# Patient Record
Sex: Female | Born: 1975 | Race: White | Hispanic: No | Marital: Married | Smoking: Never smoker
Health system: Southern US, Community
[De-identification: ages and names within clinical notes are randomized; demographics above are authoritative.]

## PROBLEM LIST (undated history)

## (undated) DIAGNOSIS — N189 Chronic kidney disease, unspecified: Secondary | ICD-10-CM

## (undated) DIAGNOSIS — I471 Supraventricular tachycardia, unspecified: Secondary | ICD-10-CM

## (undated) DIAGNOSIS — E785 Hyperlipidemia, unspecified: Secondary | ICD-10-CM

## (undated) DIAGNOSIS — F329 Major depressive disorder, single episode, unspecified: Secondary | ICD-10-CM

## (undated) DIAGNOSIS — I499 Cardiac arrhythmia, unspecified: Secondary | ICD-10-CM

## (undated) DIAGNOSIS — F32A Depression, unspecified: Secondary | ICD-10-CM

## (undated) HISTORY — PX: ESOPHAGOGASTRODUODENOSCOPY: SHX1529

## (undated) HISTORY — DX: Hyperlipidemia, unspecified: E78.5

## (undated) HISTORY — DX: Chronic kidney disease, unspecified: N18.9

## (undated) HISTORY — DX: Supraventricular tachycardia: I47.1

## (undated) HISTORY — DX: Cardiac arrhythmia, unspecified: I49.9

## (undated) HISTORY — DX: Supraventricular tachycardia, unspecified: I47.10

---
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
## 1996-09-01 HISTORY — PX: WISDOM TOOTH EXTRACTION: SHX21

## 1999-12-02 ENCOUNTER — Encounter: Admission: RE | Admit: 1999-12-02 | Discharge: 2000-03-01 | Payer: Self-pay | Admitting: *Deleted

## 2003-10-05 ENCOUNTER — Other Ambulatory Visit: Admission: RE | Admit: 2003-10-05 | Discharge: 2003-10-05 | Payer: Self-pay | Admitting: Family Medicine

## 2005-03-14 ENCOUNTER — Other Ambulatory Visit: Admission: RE | Admit: 2005-03-14 | Discharge: 2005-03-14 | Payer: Self-pay | Admitting: Family Medicine

## 2010-08-07 DIAGNOSIS — F32A Depression, unspecified: Secondary | ICD-10-CM | POA: Insufficient documentation

## 2011-01-09 DIAGNOSIS — Q839 Congenital malformation of breast, unspecified: Secondary | ICD-10-CM | POA: Insufficient documentation

## 2011-09-02 HISTORY — PX: DILATION AND CURETTAGE OF UTERUS: SHX78

## 2018-09-01 HISTORY — PX: BREAST CYST ASPIRATION: SHX578

## 2019-05-04 ENCOUNTER — Encounter (HOSPITAL_COMMUNITY): Payer: Self-pay

## 2019-05-04 ENCOUNTER — Other Ambulatory Visit: Payer: Self-pay

## 2019-05-04 ENCOUNTER — Ambulatory Visit (HOSPITAL_COMMUNITY)
Admission: EM | Admit: 2019-05-04 | Discharge: 2019-05-04 | Disposition: A | Discharge status: Home / Self Care | Payer: 59 | Attending: Family Medicine | Admitting: Family Medicine

## 2019-05-04 DIAGNOSIS — W458XXA Other foreign body or object entering through skin, initial encounter: Secondary | ICD-10-CM | POA: Diagnosis not present

## 2019-05-04 DIAGNOSIS — S61012A Laceration without foreign body of left thumb without damage to nail, initial encounter: Secondary | ICD-10-CM | POA: Diagnosis not present

## 2019-05-04 DIAGNOSIS — Z23 Encounter for immunization: Secondary | ICD-10-CM

## 2019-05-04 HISTORY — DX: Depression, unspecified: F32.A

## 2019-05-04 MED ORDER — TETANUS-DIPHTH-ACELL PERTUSSIS 5-2.5-18.5 LF-MCG/0.5 IM SUSP
INTRAMUSCULAR | Status: AC
  Filled 2019-05-04: qty 0.5

## 2019-05-04 MED ORDER — TETANUS-DIPHTH-ACELL PERTUSSIS 5-2.5-18.5 LF-MCG/0.5 IM SUSP
0.5000 mL | Freq: Once | INTRAMUSCULAR | Status: AC
  Administered 2019-05-04: 0.5 mL via INTRAMUSCULAR

## 2019-05-04 NOTE — ED Triage Notes (Signed)
Patient presents to Urgent Care with complaints of laceration to left thumb since cutting an onion this morning. Patient reports she wrapped her thumb tightly with a bandaid and 5 hours later it is still bleeding significantly. Tetanus likely 43 years old.

## 2019-05-04 NOTE — ED Provider Notes (Signed)
[facility]    CSN: [id number] Arrival date & time: 05/04/19  1444      History   Chief Complaint Chief Complaint  Patient presents with  . Appointment  . Extremity Laceration    HPI Anita Ferguson is a 43 y.o. female.   Anita Ferguson presents with complaints of laceration to tip of left thumb. She was slicing vegetables this morning and accidentally cut her thumb. She is right handed. She rinsed it briefly under water and then applied bandages. Approximately 5 hours later she removed the bandage and had significant bleeding therefore she came to be seen. Some numbness directly to the site but not generally. Unknown last tdap. No further pain. No active bleeding. Without contributing medical history.      ROS per HPI, negative if not otherwise mentioned.      Past Medical History:  Diagnosis Date  . Depression     There are no active problems to display for this patient.   Past Surgical History:  Procedure Laterality Date  . CESAREAN SECTION    . DILATION AND CURETTAGE OF UTERUS  2013  . ESOPHAGOGASTRODUODENOSCOPY    . WISDOM TOOTH EXTRACTION  1998    OB History   No obstetric history on file.      Home Medications    Prior to Admission medications   Medication Sig Start Date End Date Taking? Authorizing Provider  buPROPion (WELLBUTRIN XL) 300 MG 24 hr tablet Take 300 mg by mouth daily.   Yes [provider]  fexofenadine-pseudoephedrine (ALLEGRA-D 24) 180-240 MG 24 hr tablet Take 1 tablet by mouth daily.   Yes [provider]    Family History Family History  Problem Relation Age of Onset  . Cancer Mother   . Cancer Father   . Hypertension Father     Social History Social History   Tobacco Use  . Smoking status: Never Smoker  . Smokeless tobacco: Never Used  Substance Use Topics  . Alcohol use: Not Currently  . Drug use: Never     Allergies   Morphine and related   Review of Systems Review of Systems    Physical Exam Triage Vital Signs ED Triage Vitals  Enc Vitals Group     BP 05/04/19 1512 (!) 133/98     Pulse Rate 05/04/19 1512 79     Resp 05/04/19 1512 15     Temp 05/04/19 1512 98.2 F (36.8 C)     Temp Source 05/04/19 1512 Oral     SpO2 05/04/19 1512 99 %     Weight --      Height --      Head Circumference --      Peak Flow --      Pain Score 05/04/19 1506 0     Pain Loc --      Pain Edu? --      Excl. in GC? --    No data found.  Updated Vital Signs BP (!) 133/98 (BP Location: Right Arm)   Pulse 79   Temp 98.2 F (36.8 C) (Oral)   Resp 15   LMP 04/15/2019 (Exact Date)   SpO2 99%    Physical Exam Constitutional:      General: She is not in acute distress.    Appearance: She is well-developed.  Cardiovascular:     Rate and Rhythm: Normal rate.  Pulmonary:     Effort: Pulmonary effort is normal.  Musculoskeletal:     Left hand: She  exhibits laceration. She exhibits normal range of motion, no tenderness and no bony tenderness.     Comments: Very distal tip of left thumb with triangle shaped laceration which has started to heel, unable to fully lift to determine depth. No pain; no active bleeding; no nail or joint involvement. Full ROM of finger  Skin:    General: Skin is warm and dry.  Neurological:     Mental Status: She is alert and oriented to person, place, and time.      UC Treatments / Results  Labs (all labs ordered are listed, but only abnormal results are displayed) Labs Reviewed - No data to display  EKG   Radiology No results found.  Procedures Laceration Repair  Date/Time: 05/04/2019 5:16 PM Performed by: Zigmund Gottron, NP Authorized by: Vanessa Kick, MD   Consent:    Consent obtained:  Verbal   Consent given by:  Patient   Risks discussed:  Infection, need for additional repair, pain and poor cosmetic result   Alternatives discussed:  No treatment, observation, delayed treatment and referral Anesthesia (see MAR for exact  dosages):    Anesthesia method:  None Laceration details:    Location:  Finger   Finger location:  L thumb   Length (cm):  0.3   Depth (mm):  1 Repair type:    Repair type:  Simple Exploration:    Hemostasis achieved with:  Direct pressure   Wound exploration: wound explored through full range of motion     Wound extent: no nerve damage noted, no tendon damage noted, no underlying fracture noted and no vascular damage noted   Treatment:    Area cleansed with:  Shur-Clens   Amount of cleaning:  Standard Skin repair:    Repair method:  Tissue adhesive Approximation:    Approximation:  Close Post-procedure details:    Dressing:  Bulky dressing   (including critical care time)  Medications Ordered in UC Medications  Tdap (BOOSTRIX) injection 0.5 mL (0.5 mLs Intramuscular Given 05/04/19 1542)  Tdap (BOOSTRIX) 5-2.5-18.5 LF-MCG/0.5 injection (has no administration in time range)    Initial Impression / Assessment and Plan / UC Course  I have reviewed the triage vital signs and the nursing notes.  Pertinent labs & imaging results that were available during my care of the patient were reviewed by me and considered in my medical decision making (see chart for details).     Wound glue applied to reinforce already apparent healing laceration. Bulky dressing applied for general protection. Wound care discussed. Patient verbalized understanding and agreeable to plan.   Final Clinical Impressions(s) / UC Diagnoses   Final diagnoses:  Laceration of left thumb without foreign body without damage to nail, initial encounter     Discharge Instructions     Ice, elevation, ibuprofen as needed for pain control.  Wound glue for wound closure, ideally to remain in place for 5 days or longer. Will peel off. May keep covered to keep it protected from prematurely falling off.  If falls off too early wash daily with soap and water and keep covered to keep clean.  If develop any increased pain,  redness or drainage please return to be seen.    ED Prescriptions    None     Controlled Substance Prescriptions [place] Controlled Substance Registry consulted? Not Applicable   Zigmund Gottron, NP 05/04/19 1718

## 2019-05-04 NOTE — Discharge Instructions (Signed)
Ice, elevation, ibuprofen as needed for pain control.  Wound glue for wound closure, ideally to remain in place for 5 days or longer. Will peel off. May keep covered to keep it protected from prematurely falling off.  If falls off too early wash daily with soap and water and keep covered to keep clean.  If develop any increased pain, redness or drainage please return to be seen.

## 2019-07-07 ENCOUNTER — Other Ambulatory Visit: Payer: Self-pay | Admitting: Obstetrics and Gynecology

## 2019-07-07 DIAGNOSIS — R928 Other abnormal and inconclusive findings on diagnostic imaging of breast: Secondary | ICD-10-CM

## 2019-07-11 ENCOUNTER — Other Ambulatory Visit: Payer: Self-pay | Admitting: Obstetrics and Gynecology

## 2019-07-11 ENCOUNTER — Ambulatory Visit
Admission: RE | Admit: 2019-07-11 | Discharge: 2019-07-11 | Disposition: A | Discharge status: Home / Self Care | Payer: 59 | Source: Ambulatory Visit | Attending: Obstetrics and Gynecology | Admitting: Obstetrics and Gynecology

## 2019-07-11 ENCOUNTER — Other Ambulatory Visit: Payer: Self-pay

## 2019-07-11 DIAGNOSIS — R928 Other abnormal and inconclusive findings on diagnostic imaging of breast: Secondary | ICD-10-CM

## 2019-07-11 DIAGNOSIS — R599 Enlarged lymph nodes, unspecified: Secondary | ICD-10-CM

## 2019-07-11 DIAGNOSIS — N6001 Solitary cyst of right breast: Secondary | ICD-10-CM

## 2019-07-13 ENCOUNTER — Other Ambulatory Visit: Payer: Self-pay | Admitting: Obstetrics and Gynecology

## 2019-07-13 ENCOUNTER — Ambulatory Visit
Admission: RE | Admit: 2019-07-13 | Discharge: 2019-07-13 | Disposition: A | Discharge status: Home / Self Care | Payer: 59 | Source: Ambulatory Visit | Attending: Obstetrics and Gynecology | Admitting: Obstetrics and Gynecology

## 2019-07-13 ENCOUNTER — Other Ambulatory Visit: Payer: Self-pay

## 2019-07-13 DIAGNOSIS — N6001 Solitary cyst of right breast: Secondary | ICD-10-CM

## 2019-07-13 DIAGNOSIS — R599 Enlarged lymph nodes, unspecified: Secondary | ICD-10-CM

## 2019-10-18 ENCOUNTER — Other Ambulatory Visit: Payer: Self-pay

## 2019-10-18 ENCOUNTER — Ambulatory Visit
Admission: RE | Admit: 2019-10-18 | Discharge: 2019-10-18 | Disposition: A | Discharge status: Home / Self Care | Payer: Managed Care, Other (non HMO) | Source: Ambulatory Visit | Attending: Obstetrics and Gynecology | Admitting: Obstetrics and Gynecology

## 2019-10-18 DIAGNOSIS — N6001 Solitary cyst of right breast: Secondary | ICD-10-CM

## 2020-10-18 DIAGNOSIS — Z809 Family history of malignant neoplasm, unspecified: Secondary | ICD-10-CM | POA: Insufficient documentation

## 2020-10-24 ENCOUNTER — Other Ambulatory Visit: Payer: Self-pay | Admitting: Obstetrics and Gynecology

## 2020-10-24 DIAGNOSIS — R928 Other abnormal and inconclusive findings on diagnostic imaging of breast: Secondary | ICD-10-CM

## 2020-11-06 ENCOUNTER — Other Ambulatory Visit: Payer: Self-pay

## 2020-11-06 ENCOUNTER — Ambulatory Visit
Admission: RE | Admit: 2020-11-06 | Discharge: 2020-11-06 | Disposition: A | Discharge status: Home / Self Care | Payer: Managed Care, Other (non HMO) | Source: Ambulatory Visit | Attending: Obstetrics and Gynecology | Admitting: Obstetrics and Gynecology

## 2020-11-06 DIAGNOSIS — R928 Other abnormal and inconclusive findings on diagnostic imaging of breast: Secondary | ICD-10-CM

## 2021-04-18 IMAGING — MG MM DIGITAL DIAGNOSTIC UNILAT*L* W/ TOMO W/ CAD
6 series · 6 of 18 positions shown · non-contrast
Comparison: Previous exam(s).

CLINICAL DATA: 44-year-old female for further evaluation of
possible LEFT breast mass on screening mammogram.

EXAM:
DIGITAL DIAGNOSTIC UNILATERAL LEFT MAMMOGRAM WITH TOMOSYNTHESIS AND
CAD; ULTRASOUND LEFT BREAST LIMITED
TECHNIQUE: Left digital diagnostic mammography and breast tomosynthesis was
performed. The images were evaluated with computer-aided detection.;
Targeted ultrasound examination of the left breast was performed

[L MLO synth-2D (1 of 2)]
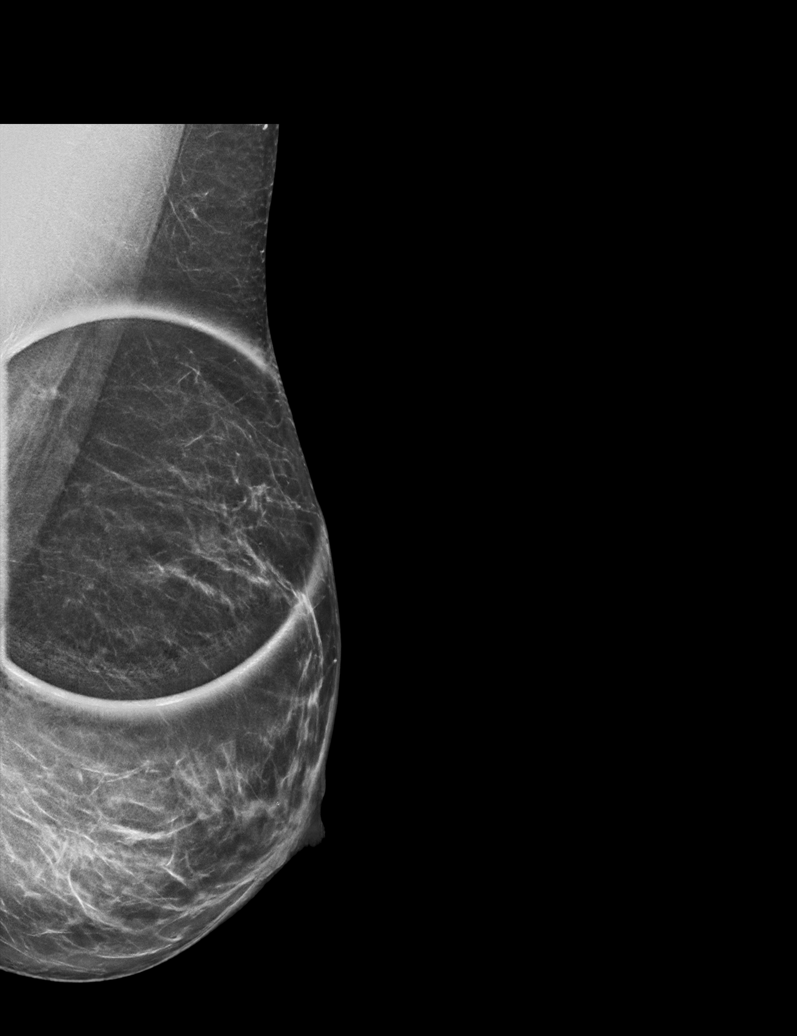

[L MLO synth-2D (2 of 2)]
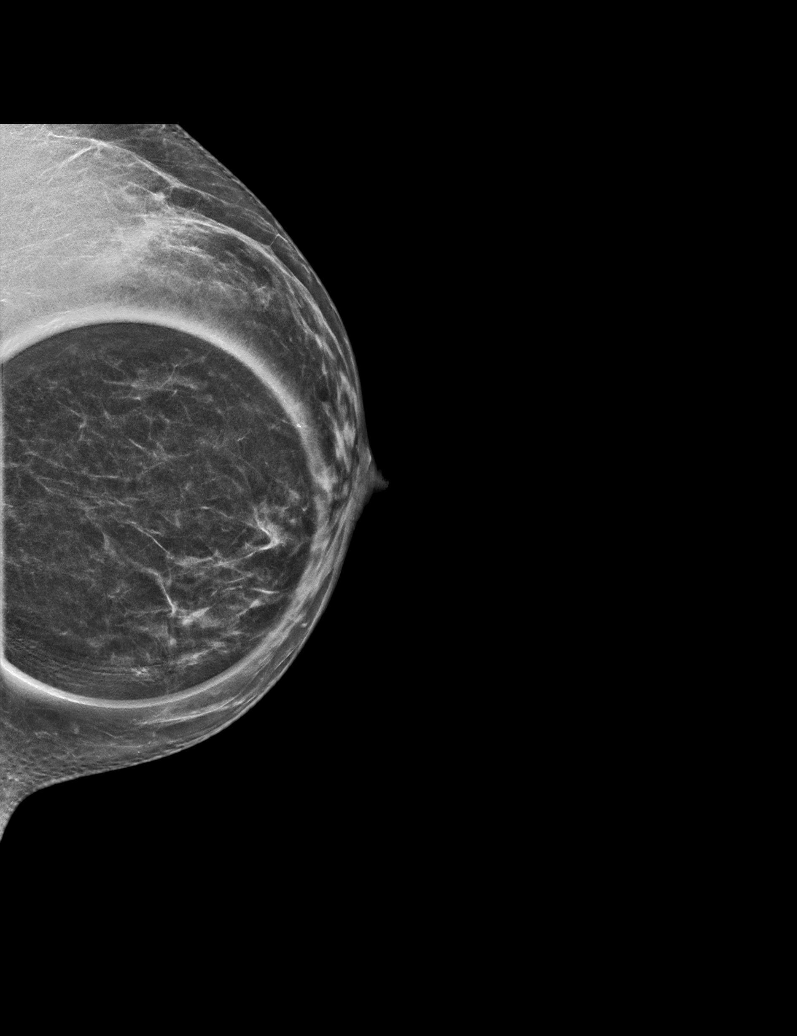

[L CC synth-2D]
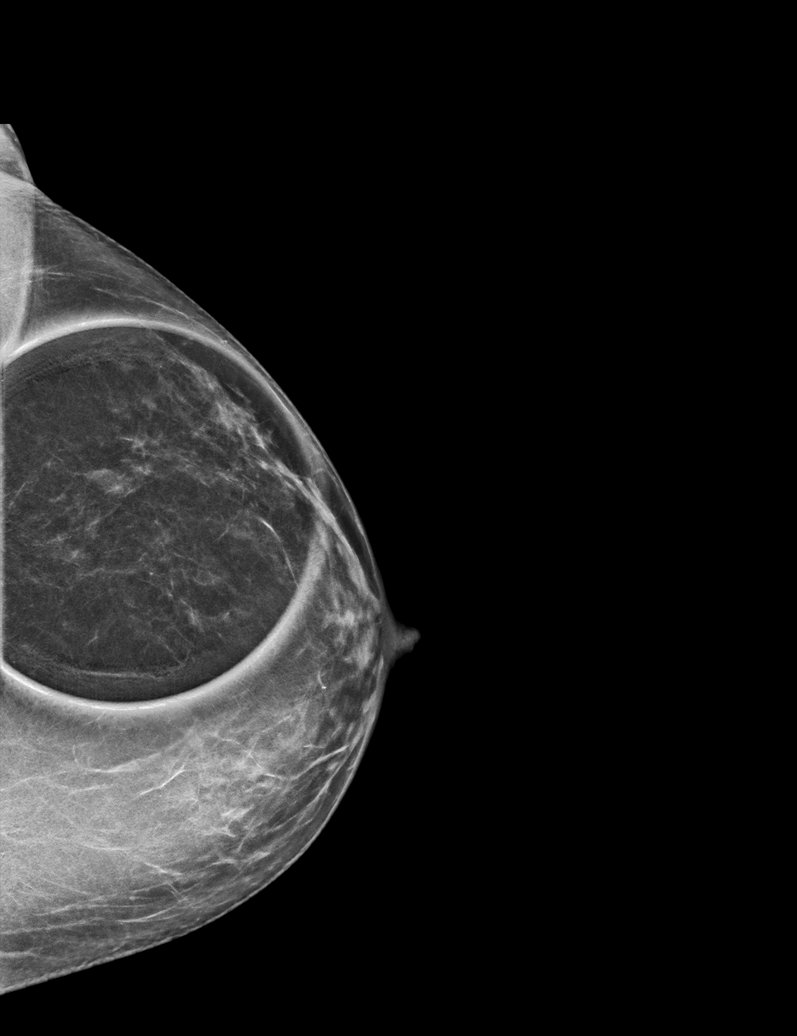

[L MLO tomo (1 of 2) · tomo slice 31/61.0]
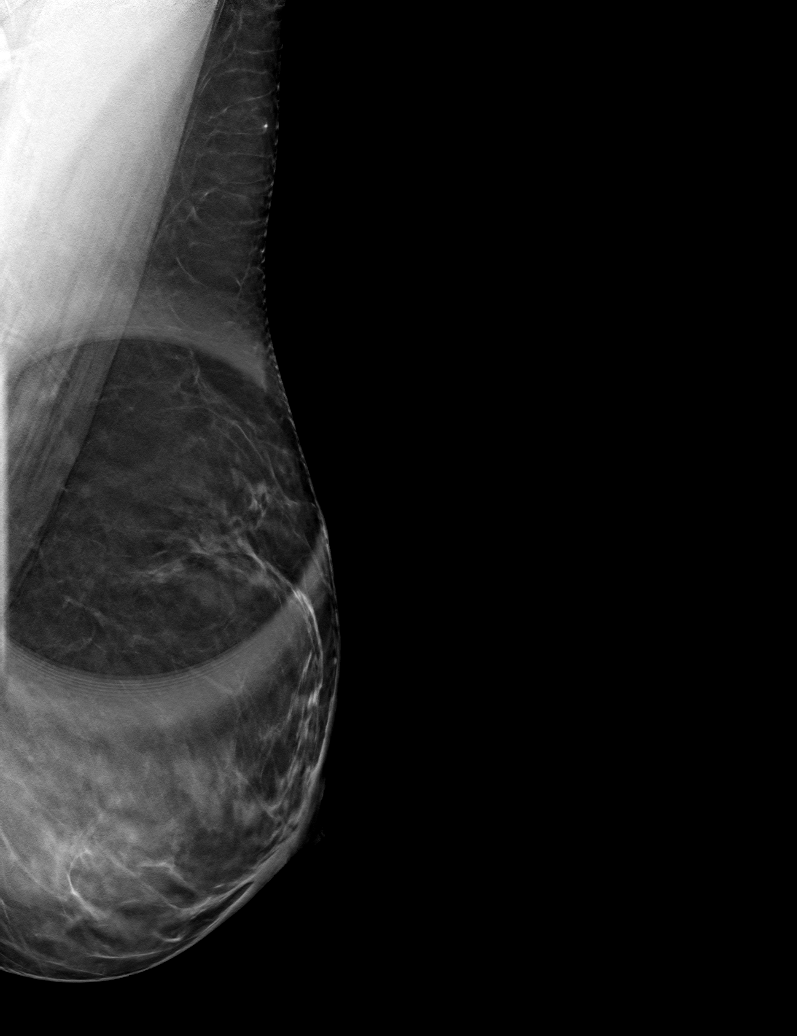

[L CC tomo · tomo slice 26/51.0]
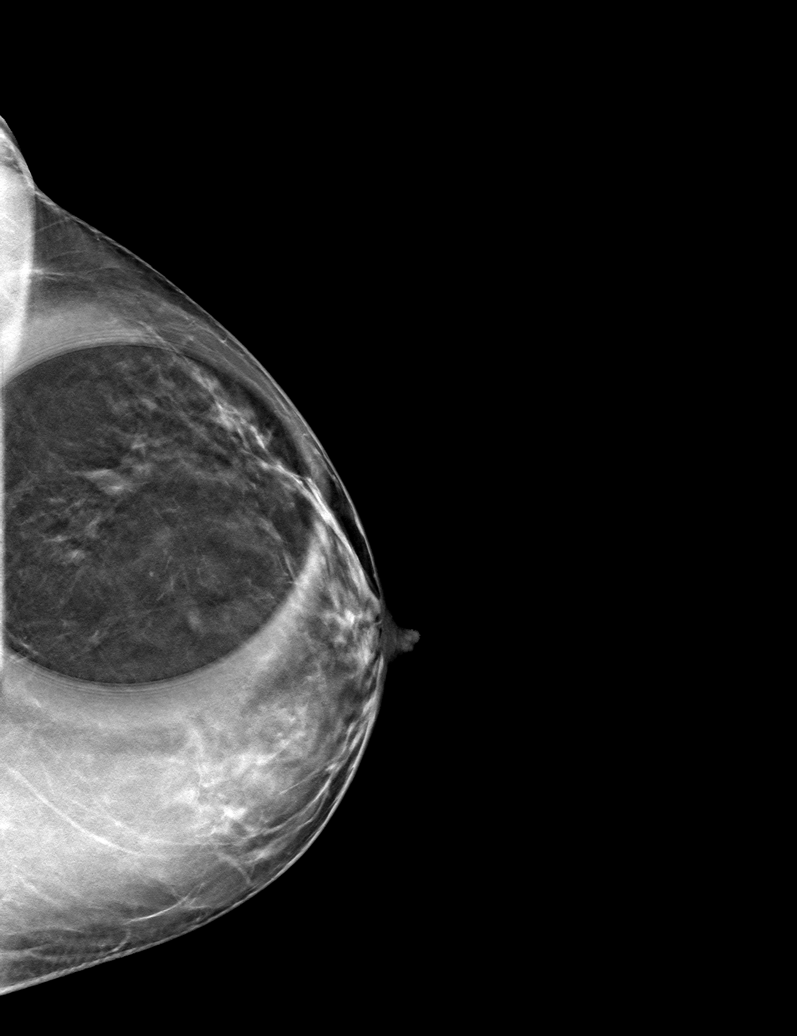

[L MLO tomo (2 of 2) · tomo slice 27/54.0]
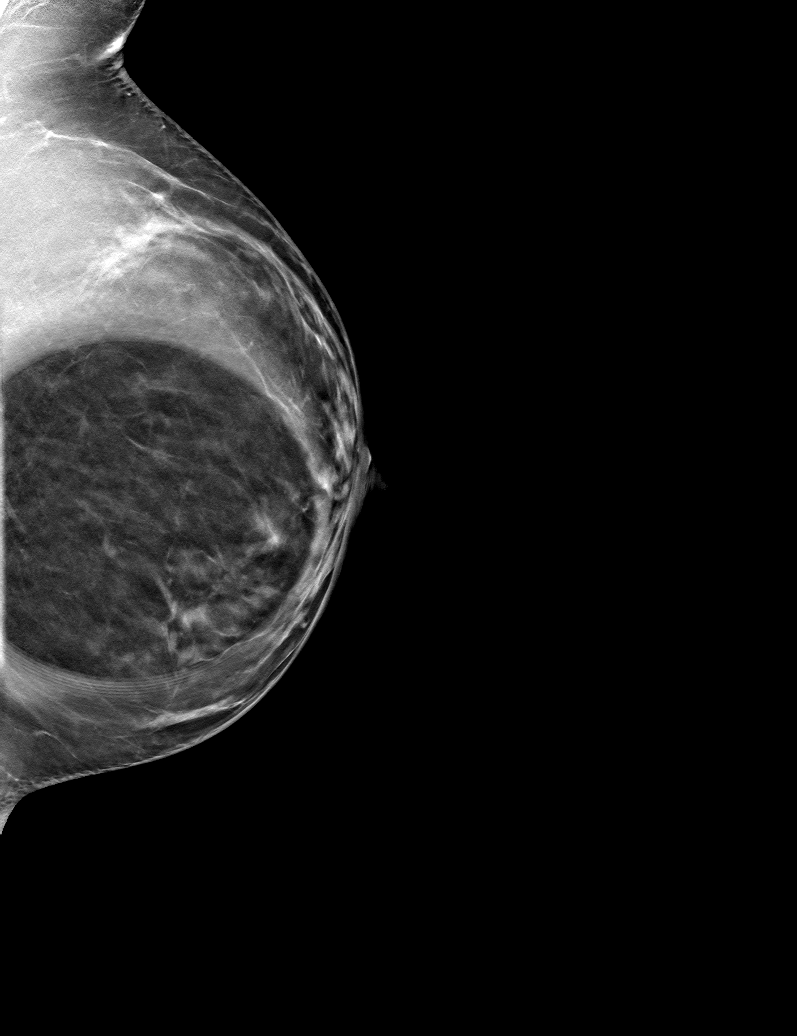

[6 of 18 positions shown; findings below may reference images not displayed]

ACR Breast Density Category b: There are scattered areas of
fibroglandular density.
FINDINGS: 2D/3D spot compression views of the LEFT breast demonstrate a
persistent circumscribed low-density oval mass within the
UPPER-OUTER LEFT breast.

Targeted ultrasound is performed, showing a 0.6 x 0.2 x 0.8 cm
benign simple cyst at the 1 o'clock position of the LEFT breast 6 cm
from the nipple, corresponding to the mammographic finding. No other
sonographic abnormalities are noted within the OUTER LEFT breast.
IMPRESSION: Benign cyst within the UPPER-OUTER LEFT breast, corresponding to the
screening study finding.

RECOMMENDATION:
Bilateral screening mammogram in 1 year.

I have discussed the findings and recommendations with the patient.
If applicable, a reminder letter will be sent to the patient
regarding the next appointment.

BI-RADS CATEGORY  2: Benign.

## 2021-04-18 IMAGING — US US BREAST*L* LIMITED INC AXILLA
1 series · 5 of 5 positions shown · non-contrast
Comparison: Previous exam(s).

CLINICAL DATA: 44-year-old female for further evaluation of
possible LEFT breast mass on screening mammogram.

EXAM:
DIGITAL DIAGNOSTIC UNILATERAL LEFT MAMMOGRAM WITH TOMOSYNTHESIS AND
CAD; ULTRASOUND LEFT BREAST LIMITED
TECHNIQUE: Left digital diagnostic mammography and breast tomosynthesis was
performed. The images were evaluated with computer-aided detection.;
Targeted ultrasound examination of the left breast was performed

[Series 1: us breast*left* limited inc axilla · 0.06mm/px · 5 of 5 slices shown]
[im 1/5]
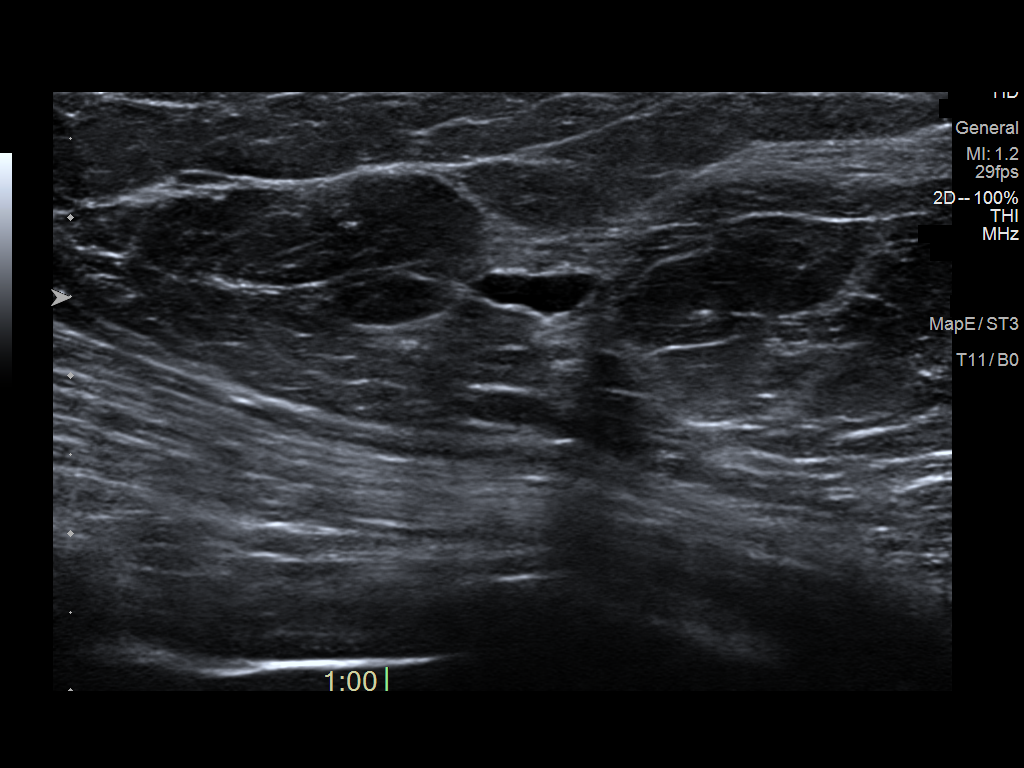
[im 2/5]
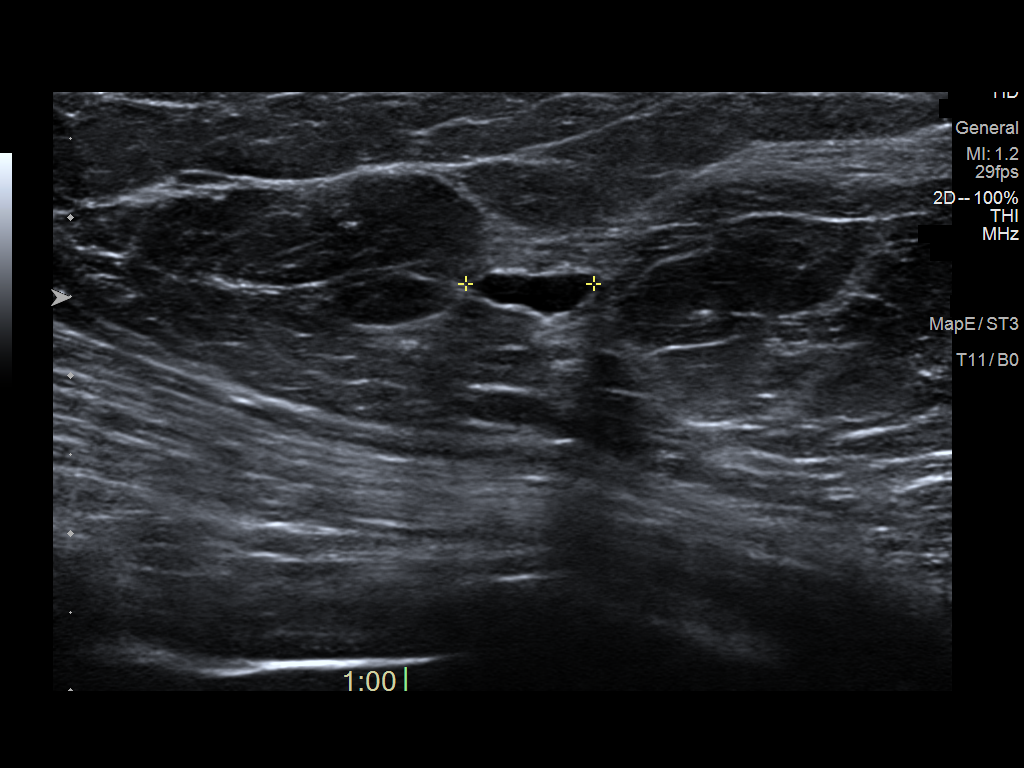
[im 3/5]
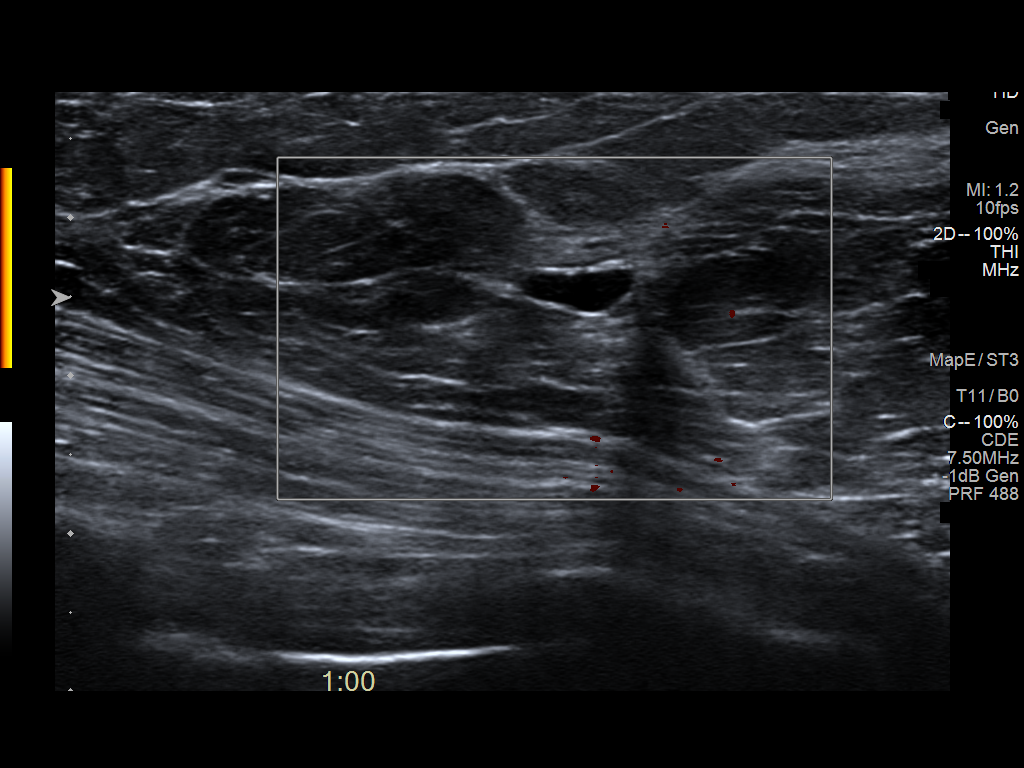
[im 4/5]
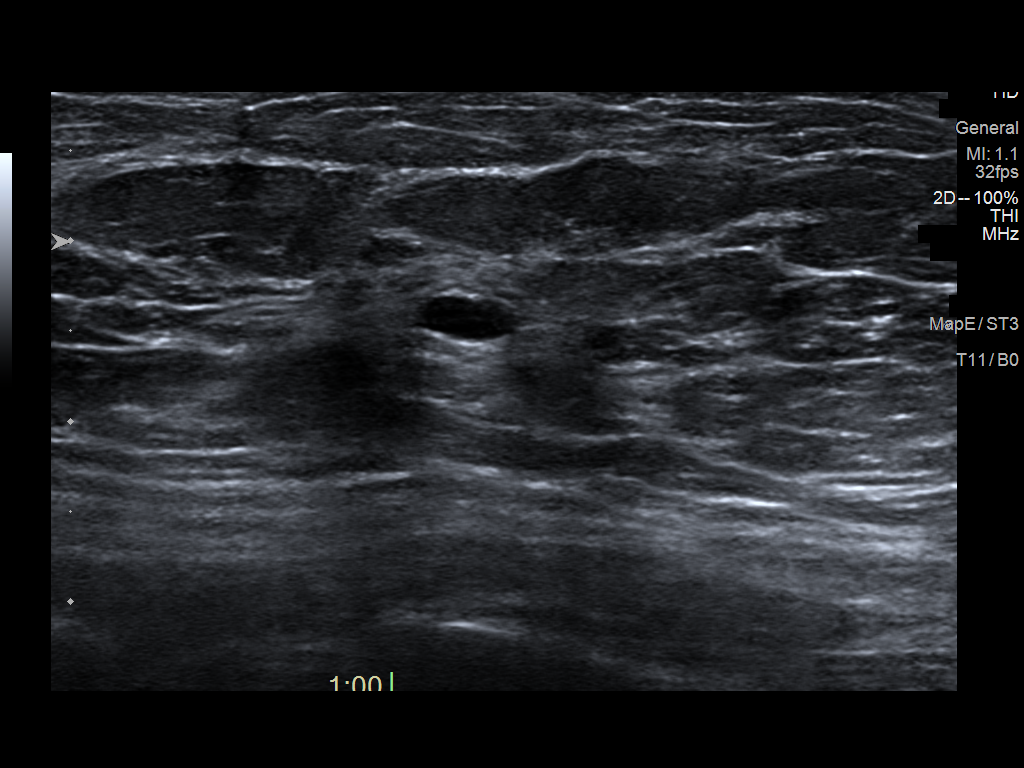
[im 5/5]
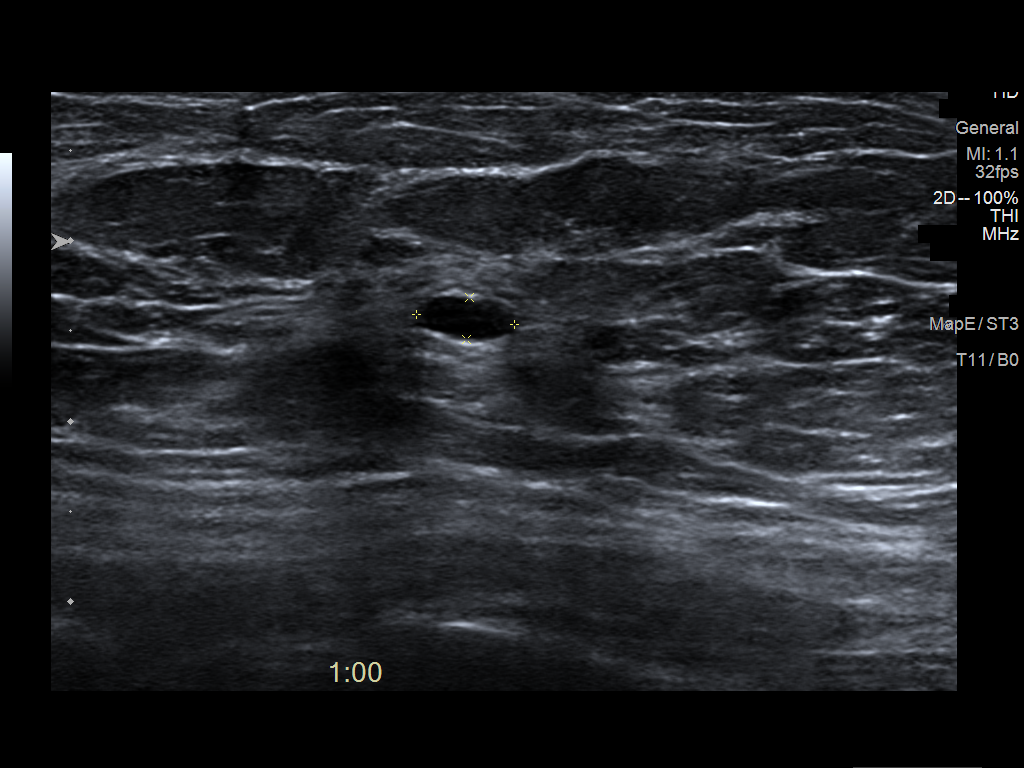

[5 of 5 positions shown; findings below may reference images not displayed]

ACR Breast Density Category b: There are scattered areas of
fibroglandular density.
FINDINGS: 2D/3D spot compression views of the LEFT breast demonstrate a
persistent circumscribed low-density oval mass within the
UPPER-OUTER LEFT breast.

Targeted ultrasound is performed, showing a 0.6 x 0.2 x 0.8 cm
benign simple cyst at the 1 o'clock position of the LEFT breast 6 cm
from the nipple, corresponding to the mammographic finding. No other
sonographic abnormalities are noted within the OUTER LEFT breast.
IMPRESSION: Benign cyst within the UPPER-OUTER LEFT breast, corresponding to the
screening study finding.

RECOMMENDATION:
Bilateral screening mammogram in 1 year.

I have discussed the findings and recommendations with the patient.
If applicable, a reminder letter will be sent to the patient
regarding the next appointment.

BI-RADS CATEGORY  2: Benign.

## 2022-04-09 ENCOUNTER — Encounter (HOSPITAL_COMMUNITY): Payer: Self-pay

## 2022-04-09 ENCOUNTER — Emergency Department (HOSPITAL_COMMUNITY)
Admission: EM | Admit: 2022-04-09 | Discharge: 2022-04-09 | Disposition: A | Discharge status: Home / Self Care | Payer: Managed Care, Other (non HMO) | Attending: Emergency Medicine | Admitting: Emergency Medicine

## 2022-04-09 ENCOUNTER — Other Ambulatory Visit: Payer: Self-pay

## 2022-04-09 ENCOUNTER — Emergency Department (HOSPITAL_COMMUNITY): Payer: Managed Care, Other (non HMO)

## 2022-04-09 DIAGNOSIS — I471 Supraventricular tachycardia: Secondary | ICD-10-CM | POA: Diagnosis not present

## 2022-04-09 DIAGNOSIS — R002 Palpitations: Secondary | ICD-10-CM | POA: Diagnosis present

## 2022-04-09 LAB — BASIC METABOLIC PANEL
Anion gap: 6 (ref 5–15)
BUN: 13 mg/dL (ref 6–20)
CO2: 21 mmol/L — ABNORMAL LOW (ref 22–32)
Calcium: 8.9 mg/dL (ref 8.9–10.3)
Chloride: 110 mmol/L (ref 98–111)
Creatinine, Ser: 1.17 mg/dL — ABNORMAL HIGH (ref 0.44–1.00)
GFR, Estimated: 59 mL/min — ABNORMAL LOW (ref >60)
Glucose, Bld: 99 mg/dL (ref 70–99)
Potassium: 4.1 mmol/L (ref 3.5–5.1)
Sodium: 137 mmol/L (ref 135–145)

## 2022-04-09 LAB — CBC WITH DIFFERENTIAL/PLATELET
Abs Immature Granulocytes: 0.02 10*3/uL (ref 0.00–0.07)
Basophils Absolute: 0.1 10*3/uL (ref 0.0–0.1)
Basophils Relative: 1 %
Eosinophils Absolute: 0.1 10*3/uL (ref 0.0–0.5)
Eosinophils Relative: 2 %
HCT: 39.8 % (ref 36.0–46.0)
Hemoglobin: 13.9 g/dL (ref 12.0–15.0)
Immature Granulocytes: 0 %
Lymphocytes Relative: 30 %
Lymphs Abs: 1.7 10*3/uL (ref 0.7–4.0)
MCH: 31.2 pg (ref 26.0–34.0)
MCHC: 34.9 g/dL (ref 30.0–36.0)
MCV: 89.2 fL (ref 80.0–100.0)
Monocytes Absolute: 0.5 10*3/uL (ref 0.1–1.0)
Monocytes Relative: 8 %
Neutro Abs: 3.3 10*3/uL (ref 1.7–7.7)
Neutrophils Relative %: 59 %
Platelets: 177 10*3/uL (ref 150–400)
RBC: 4.46 MIL/uL (ref 3.87–5.11)
RDW: 12.6 % (ref 11.5–15.5)
WBC: 5.7 10*3/uL (ref 4.0–10.5)
nRBC: 0 % (ref 0.0–0.2)

## 2022-04-09 LAB — TROPONIN I (HIGH SENSITIVITY)
Troponin I (High Sensitivity): 15 ng/L (ref <18)
Troponin I (High Sensitivity): 34 ng/L — ABNORMAL HIGH (ref <18)

## 2022-04-09 LAB — I-STAT BETA HCG BLOOD, ED (MC, WL, AP ONLY): I-stat hCG, quantitative: <5 m[IU]/mL (ref <5)

## 2022-04-09 LAB — CBG MONITORING, ED: Glucose-Capillary: 105 mg/dL — ABNORMAL HIGH (ref 70–99)

## 2022-04-09 LAB — TSH: TSH: 1.823 u[IU]/mL (ref 0.350–4.500)

## 2022-04-09 LAB — MAGNESIUM: Magnesium: 1.9 mg/dL (ref 1.7–2.4)

## 2022-04-09 MED ORDER — SODIUM CHLORIDE 0.9 % IV BOLUS
1000.0000 mL | Freq: Once | INTRAVENOUS | Status: AC
  Administered 2022-04-09: 1000 mL via INTRAVENOUS

## 2022-04-09 NOTE — ED Provider Notes (Signed)
[facility] DEPT Provider Note   CSN: [id number] Arrival date & time: 04/09/22  1418     History  Chief Complaint  Patient presents with   Palpitations    Anita Ferguson is a 46 y.o. female.  Patient is a 46 year old female with no previous cardiac arrhythmias presenting for SVT.  Patient states she was helping move furniture in her child's room when she began having chest tightness, shortness of breath, palpitations, and sweating.  States she called EMS and was told she had a heart rate in the 230s and blood pressure 99/74.  KG was reported to be in SVT.  Patient was instructed to do a Valsalva maneuver by EMS and given adenosine 6 mg which converted her to normal sinus rhythm.  Patient otherwise denies any illnesses.  Admits to insomnia over the last several nights.  Admits to dehydration.  No prior history of cardiac arrhythmias or SVT.  States she had coffee for breakfast and has not any food or water since.  The history is provided by the patient. No language interpreter was used.  Palpitations Associated symptoms: chest pain and shortness of breath   Associated symptoms: no back pain, no cough and no vomiting        Home Medications Prior to Admission medications   Medication Sig Start Date End Date Taking? Authorizing Provider  Ascorbic Acid (VITAMIN C) 1000 MG tablet Take 1,000 mg by mouth daily.   Yes [provider]  buPROPion (WELLBUTRIN XL) 300 MG 24 hr tablet Take 300 mg by mouth daily.   Yes [provider]  Cholecalciferol (VITAMIN D) 50 MCG (2000 UT) CAPS Take 3 capsules by mouth daily.   Yes [provider]  [employer] CRANBERRY PO Take 1 capsule by mouth daily.   Yes [provider]  Fexofenadine-Pseudoephedrine (ALLEGRA-D PO) Take 1 tablet by mouth at bedtime.   Yes [provider]  levonorgestrel (MIRENA) 20 MCG/DAY IUD 1 each by Intrauterine route once.   Yes [provider]   Omega-3 Fatty Acids (FISH OIL) 1200 MG CAPS Take 1 capsule by mouth daily.   Yes [provider]  Probiotic Product (PROBIOTIC PO) Take 1 capsule by mouth daily.   Yes [provider]      Allergies    Other, Codeine, Doxycycline, Morphine and related, and Trazodone    Review of Systems   Review of Systems  Constitutional:  Negative for chills and fever.  HENT:  Negative for ear pain and sore throat.   Eyes:  Negative for pain and visual disturbance.  Respiratory:  Positive for shortness of breath. Negative for cough.   Cardiovascular:  Positive for chest pain and palpitations.  Gastrointestinal:  Negative for abdominal pain and vomiting.  Genitourinary:  Negative for dysuria and hematuria.  Musculoskeletal:  Negative for arthralgias and back pain.  Skin:  Negative for color change and rash.  Neurological:  Negative for seizures and syncope.  All other systems reviewed and are negative.   Physical Exam Updated Vital Signs BP 125/89   Pulse 97   Temp 98.2 F (36.8 C) (Oral)   Resp 20   Ht 5\' 8"  (1.727 m)   Wt 88.5 kg   SpO2 98%   BMI 29.65 kg/m  Physical Exam Vitals and nursing note reviewed.  Constitutional:      General: She is not in acute distress.    Appearance: She is well-developed.  HENT:     Head: Normocephalic and atraumatic.  Eyes:     Conjunctiva/sclera: Conjunctivae normal.  Cardiovascular:     Rate and Rhythm: Regular rhythm. Tachycardia present.     Heart sounds: No murmur heard. Pulmonary:     Effort: Pulmonary effort is normal. No respiratory distress.     Breath sounds: Normal breath sounds.  Abdominal:     Palpations: Abdomen is soft.     Tenderness: There is no abdominal tenderness.  Musculoskeletal:        General: No swelling.     Cervical back: Neck supple.  Skin:    General: Skin is warm and dry.     Capillary Refill: Capillary refill takes less than 2 seconds.  Neurological:     Mental Status: She is alert.   Psychiatric:        Mood and Affect: Mood normal.     ED Results / Procedures / Treatments   Labs (all labs ordered are listed, but only abnormal results are displayed) Labs Reviewed  BASIC METABOLIC PANEL - Abnormal; Notable for the following components:      Result Value   CO2 21 (*)    Creatinine, Ser 1.17 (*)    GFR, Estimated 59 (*)    All other components within normal limits  CBG MONITORING, ED - Abnormal; Notable for the following components:   Glucose-Capillary 105 (*)    All other components within normal limits  MAGNESIUM  CBC WITH DIFFERENTIAL/PLATELET  TSH  I-STAT BETA HCG BLOOD, ED ([facility], [facility], [facility] ONLY)  TROPONIN I (HIGH SENSITIVITY)  TROPONIN I (HIGH SENSITIVITY)    EKG EKG Interpretation  Date/Time:  Wednesday April 09 2022 14:38:38 EDT Ventricular Rate:  94 PR Interval:  159 QRS Duration: 87 QT Interval:  344 QTC Calculation: 431 R Axis:   82 Text Interpretation: Sinus rhythm Probable left atrial enlargement Confirmed by Vonita Moss [phone]) on 04/09/2022 2:50:53 PM  Radiology DG Chest Portable 1 View  Result Date: 04/09/2022 CLINICAL DATA:  SVT tightness in chest, shortness of breath EXAM: PORTABLE CHEST 1 VIEW COMPARISON:  None Available. FINDINGS: Cardiac and mediastinal contours are within normal limits. No focal pulmonary opacity. No pleural effusion or pneumothorax. No acute osseous abnormality. IMPRESSION: No acute cardiopulmonary process. Electronically Signed   By: Wiliam Ke M.D.   On: 04/09/2022 15:44    Procedures Procedures    Medications Ordered in ED Medications  sodium chloride 0.9 % bolus 1,000 mL (0 mLs Intravenous Stopped 04/09/22 1700)    ED Course/ Medical Decision Making/ A&P                           Medical Decision Making Amount and/or Complexity of Data Reviewed Labs: ordered. Radiology: ordered.   9:59 PM 46 year old female with no previous cardiac arrhythmias presenting for SVT.  Patient is alert and oriented  x 3, no acute distress, afebrile, stable vital signs. ECG demonstrates normal sinus rhythm with a rate of 94 bpm.  In the room patient is tachycardic ranging from 110 to 114 bpm.  No heart murmurs.  Equal bilateral clear lung fields.  This x-ray demonstrates no acute process.  Patient has stable troponins and electrolytes.  Urine pregnancy negative.  AKI present concerning for dehydration.  Liter of IV fluids given.  Sinus tachycardia resolved at this time.  Patient stable for discharge home at this time with recommendations for cardiology follow-up for resolved SVT.  Patient in no distress and overall condition improved here in the ED. Detailed  discussions were had with the patient regarding current findings, and need for close f/u with PCP or on call doctor. The patient has been instructed to return immediately if the symptoms worsen in any way for re-evaluation. Patient verbalized understanding and is in agreement with current care plan. All questions answered prior to discharge.         Final Clinical Impression(s) / ED Diagnoses Final diagnoses:  SVT (supraventricular tachycardia) (HCC)    Rx / DC Orders ED Discharge Orders     None         Franne Forts, DO 04/09/22 1728

## 2022-04-09 NOTE — ED Triage Notes (Signed)
Patient presents from home due to SVT. After putting down a heavy box, she noticed tightness in her chest and shortness of breath. EMS noted she was pale and diaphoretic. Her initial HR was 230 and BP was 99/74. After valsalva maneuver and then 6 of Adenosine the patient converted. EMS also administered 500 ml NS.    EMS vitals: 100% SPO2 on room air 102 HR 116/74 BP

## 2022-04-09 NOTE — Discharge Instructions (Addendum)
Today you were found to have something called SVT.  This stands for supraventricular tachycardia which is an abnormal heart rhythm.  You were converted to normal sinus rhythm after chemical cardioversion with adenosine by EMS.  Heart rhythm could have been provoked by insomnia, dehydration, or caffeine consumption.  If your symptoms return please return to emergency department mediately.  Otherwise follow-up with cardiologist in the next 1 to 2 weeks for reevaluation.  Decrease caffeine intake.  Stay hydrated.

## 2022-04-23 NOTE — Progress Notes (Unsigned)
Cardiology Office Note:    Date:  05/01/2022   ID:  Anita, Ferguson 05-May-1976, MRN [id number]  PCP:  Soundra Pilon, FNP    [facility] Providers Cardiologist:  Destine Ambroise [place], MD     Referring MD: Darrin Nipper Family M*   Chief Complaint  Patient presents with   Palpitations    History of Present Illness:    Anita Ferguson is a 46 y.o. adult seen at the request of Dr Drema Pry For evaluation of SVT. She was seen in the ED on 04/09/22 after episode of SVT. Patient states she was helping move furniture in her child's room when she began having chest tightness,palpitations, and sweating.  States she called EMS and was told she had a heart rate in the 220s and blood pressure 99/74.  EKG was reported to be in SVT.  Patient was instructed to do a Valsalva maneuver by EMS and given adenosine 6 mg which converted her to normal sinus rhythm. In the ED Ecg was normal. Labs were remarkable for troponin 34. Mild renal insufficiency. Electrolytes, TSH and CBC normal. She was given some hydration and DC home.   Since this episode she has no recurrence. States in the past she did have some palpitations but nothing sustained. She did cut out caffeine but was only drinking one cup a day. Did not feel lightheaded. She does note some chronic swelling.   Past Medical History:  Diagnosis Date   Arrhythmia    Chronic kidney disease    Depression    SVT (supraventricular tachycardia) (HCC)     Past Surgical History:  Procedure Laterality Date   CESAREAN SECTION     DILATION AND CURETTAGE OF UTERUS  2013   ESOPHAGOGASTRODUODENOSCOPY     WISDOM TOOTH EXTRACTION  1998    Current Medications: Current Meds  Medication Sig   Ascorbic Acid (VITAMIN C) 1000 MG tablet Take 1,000 mg by mouth daily.   buPROPion (WELLBUTRIN XL) 300 MG 24 hr tablet Take 300 mg by mouth daily.   Cholecalciferol (VITAMIN D) 50 MCG (2000 UT) CAPS Take 3 capsules by mouth daily.   [employer] CRANBERRY PO Take 1  capsule by mouth daily.   fexofenadine (ALLEGRA ALLERGY) 180 MG tablet Take 1 tablet (180 mg total) by mouth at bedtime.   levonorgestrel (MIRENA) 20 MCG/DAY IUD 1 each by Intrauterine route once.   metoprolol tartrate (LOPRESSOR) 50 MG tablet Take 1 tablet (50 mg total) by mouth daily as needed.   Omega-3 Fatty Acids (FISH OIL) 1200 MG CAPS Take 1 capsule by mouth daily.   Probiotic Product (PROBIOTIC PO) Take 1 capsule by mouth daily.   [DISCONTINUED] Fexofenadine-Pseudoephedrine (ALLEGRA-D PO) Take 1 tablet by mouth at bedtime.     Allergies:   Other, Codeine, Doxycycline, Morphine and related, and Trazodone   Social History   Socioeconomic History   Marital status: Married    Spouse name: Not on file   Number of children: 1   Years of education: Not on file   Highest education level: Not on file  Occupational History   Not on file  Tobacco Use   Smoking status: Never   Smokeless tobacco: Never  Vaping Use   Vaping Use: Never used  Substance and Sexual Activity   Alcohol use: Not Currently   Drug use: Never   Sexual activity: Not on file  Other Topics Concern   Not on file  Social History Narrative   Geologist   Social Determinants of  Health   Financial Resource Strain: Not on file  Food Insecurity: Not on file  Transportation Needs: Not on file  Physical Activity: Not on file  Stress: Not on file  Social Connections: Not on file     Family History: The patient's family history includes Brain cancer in her father; Cancer in her father; Hypertension in her father.  ROS:   Please see the history of present illness.     All other systems reviewed and are negative.  EKGs/Labs/Other Studies Reviewed:    The following studies were reviewed today: I personally reviewed EMS Ecg on 04/09/22 which showed SVT rate 204. Then converted to NSR with normal Ecg.   EKG:  EKG is  ordered today.  The ekg ordered today demonstrates NSR rate 71. Normal. I have personally reviewed  and interpreted this study.   Recent Labs: 04/09/2022: BUN 13; Creatinine, Ser 1.17; Hemoglobin 13.9; Magnesium 1.9; Platelets 177; Potassium 4.1; Sodium 137; TSH 1.823  Recent Lipid Panel No results found for: "CHOL", "TRIG", "HDL", "CHOLHDL", "VLDL", "LDLCALC", "LDLDIRECT"  Dated 02/28/22: a1C 4.8%. cholesterol 193, triglycerides 236, HDL 47, LDL 105.    Risk Assessment/Calculations:                Physical Exam:    VS:  BP 110/72   Pulse 71   Ht 5\' 8"  (1.727 m)   Wt 196 lb 9.6 oz (89.2 kg)   SpO2 99%   BMI 29.89 kg/m     Wt Readings from Last 3 Encounters:  05/01/22 196 lb 9.6 oz (89.2 kg)  04/09/22 195 lb (88.5 kg)     GEN:  Well nourished, well developed in no acute distress HEENT: Normal NECK: No JVD; No carotid bruits LYMPHATICS: No lymphadenopathy CARDIAC: RRR, no murmurs, rubs, gallops RESPIRATORY:  Clear to auscultation without rales, wheezing or rhonchi  ABDOMEN: Soft, non-tender, non-distended MUSCULOSKELETAL:  No edema; No deformity  SKIN: Warm and dry NEUROLOGIC:  Alert and oriented x 3 PSYCHIATRIC:  Normal affect   ASSESSMENT:    1. SVT (supraventricular tachycardia) (HCC)   2. Swelling    PLAN:    In order of problems listed above:  SVT with sudden onset and termination with IV adenosine c/w reentry tachycardia. Discussed mechanism of arrhythmia. Will check Echo. Given Rx for metoprolol tartrate 50 mg to use PRN. Will avoid stimulants. Instructed in Valsalva maneuver. If SVT recurs more often would consider ablation Swelling- check Echo.            Medication Adjustments/Labs and Tests Ordered: Current medicines are reviewed at length with the patient today.  Concerns regarding medicines are outlined above.  Orders Placed This Encounter  Procedures   EKG 12-Lead   Meds ordered this encounter  Medications   fexofenadine (ALLEGRA ALLERGY) 180 MG tablet    Sig: Take 1 tablet (180 mg total) by mouth at bedtime.   metoprolol tartrate  (LOPRESSOR) 50 MG tablet    Sig: Take 1 tablet (50 mg total) by mouth daily as needed.    Dispense:  30 tablet    Refill:  3    Patient Instructions  Medication Instructions:  Your physician has recommended you make the following change in your medication:  START: Metoprolol tartate (Lopressor) 50 mg as needed for svt *If you need a refill on your cardiac medications before your next appointment, please call your pharmacy*   Lab Work: None If you have labs (blood work) drawn today and your tests are completely normal, you  will receive your results only by: MyChart Message (if you have MyChart) OR A paper copy in the mail If you have any lab test that is abnormal or we need to change your treatment, we will call you to review the results.   Testing/Procedures: Your physician has requested that you have an echocardiogram. Echocardiography is a painless test that uses sound waves to create images of your heart. It provides your doctor with information about the size and shape of your heart and how well your heart's chambers and valves are working. This procedure takes approximately one hour. There are no restrictions for this procedure.    Follow-Up: At [facility], you and your health needs are our priority.  As part of our continuing mission to provide you with exceptional heart care, we have created designated Provider Care Teams.  These Care Teams include your primary Cardiologist (physician) and Advanced Practice Providers (APPs -  Physician Assistants and Nurse Practitioners) who all work together to provide you with the care you need, when you need it.  We recommend signing up for the patient portal called "MyChart".  Sign up information is provided on this After Visit Summary.  MyChart is used to connect with patients for Virtual Visits (Telemedicine).  Patients are able to view lab/test results, encounter notes, upcoming appointments, etc.  Non-urgent messages can be sent  to your provider as well.   To learn more about what you can do with MyChart, go to [email or web address].    Your next appointment:   3 month(s)  The format for your next appointment:   In Person  Provider:   Layla Kesling [place], MD     Other Instructions   Important Information About Sugar         Signed, Kevron Patella [place], MD  05/01/2022 9:02 AM    [facility]

## 2022-05-01 ENCOUNTER — Ambulatory Visit: Payer: Managed Care, Other (non HMO) | Attending: Cardiology | Admitting: Cardiology

## 2022-05-01 ENCOUNTER — Encounter: Payer: Self-pay | Admitting: Cardiology

## 2022-05-01 VITALS — BP 110/72 | HR 71 | Ht 68.0 in | Wt 196.6 lb

## 2022-05-01 DIAGNOSIS — I471 Supraventricular tachycardia: Secondary | ICD-10-CM

## 2022-05-01 DIAGNOSIS — R609 Edema, unspecified: Secondary | ICD-10-CM

## 2022-05-01 MED ORDER — METOPROLOL TARTRATE 50 MG PO TABS: 50.0000 mg | ORAL_TABLET | Freq: Every day | ORAL | 3 refills | Status: DC | PRN

## 2022-05-01 MED ORDER — FEXOFENADINE HCL 180 MG PO TABS: 180.0000 mg | ORAL_TABLET | Freq: Every day | ORAL | Status: AC

## 2022-05-01 NOTE — Patient Instructions (Addendum)
Medication Instructions:  Your physician has recommended you make the following change in your medication:  START: Metoprolol tartrate (Lopressor) 50 mg as needed for svt *If you need a refill on your cardiac medications before your next appointment, please call your pharmacy*   Lab Work: None If you have labs (blood work) drawn today and your tests are completely normal, you will receive your results only by: MyChart Message (if you have MyChart) OR A paper copy in the mail If you have any lab test that is abnormal or we need to change your treatment, we will call you to review the results.   Testing/Procedures: Your physician has requested that you have an echocardiogram. Echocardiography is a painless test that uses sound waves to create images of your heart. It provides your doctor with information about the size and shape of your heart and how well your heart's chambers and valves are working. This procedure takes approximately one hour. There are no restrictions for this procedure.    Follow-Up: At [facility], you and your health needs are our priority.  As part of our continuing mission to provide you with exceptional heart care, we have created designated Provider Care Teams.  These Care Teams include your primary Cardiologist (physician) and Advanced Practice Providers (APPs -  Physician Assistants and Nurse Practitioners) who all work together to provide you with the care you need, when you need it.  We recommend signing up for the patient portal called "MyChart".  Sign up information is provided on this After Visit Summary.  MyChart is used to connect with patients for Virtual Visits (Telemedicine).  Patients are able to view lab/test results, encounter notes, upcoming appointments, etc.  Non-urgent messages can be sent to your provider as well.   To learn more about what you can do with MyChart, go to [email or web address].    Your next appointment:   3  month(s)  The format for your next appointment:   In Person  Provider:   Peter [place]iland, MD or an APP     Other Instructions   Important Information About Sugar

## 2022-05-16 ENCOUNTER — Ambulatory Visit (HOSPITAL_COMMUNITY): Payer: Managed Care, Other (non HMO) | Attending: Cardiology

## 2022-05-16 DIAGNOSIS — I340 Nonrheumatic mitral (valve) insufficiency: Secondary | ICD-10-CM

## 2022-05-16 DIAGNOSIS — I471 Supraventricular tachycardia: Secondary | ICD-10-CM | POA: Insufficient documentation

## 2022-05-16 DIAGNOSIS — I341 Nonrheumatic mitral (valve) prolapse: Secondary | ICD-10-CM | POA: Diagnosis not present

## 2022-05-16 LAB — ECHOCARDIOGRAM COMPLETE
Area-P 1/2: 5.02 cm2
S' Lateral: 3 cm

## 2022-08-06 ENCOUNTER — Encounter: Payer: Self-pay | Admitting: Physician Assistant

## 2022-08-06 ENCOUNTER — Ambulatory Visit: Payer: Managed Care, Other (non HMO) | Attending: Physician Assistant | Admitting: Physician Assistant

## 2022-08-06 VITALS — BP 104/70 | HR 94 | Ht 68.0 in | Wt 194.2 lb

## 2022-08-06 DIAGNOSIS — I471 Supraventricular tachycardia, unspecified: Secondary | ICD-10-CM | POA: Diagnosis not present

## 2022-08-06 NOTE — Patient Instructions (Signed)
Medication Instructions:  Your physician recommends that you continue on your current medications as directed. Please refer to the Current Medication list given to you today.  *If you need a refill on your cardiac medications before your next appointment, please call your pharmacy*  Lab Work: NONE ordered at this time of appointment   If you have labs (blood work) drawn today and your tests are completely normal, you will receive your results only by: MyChart Message (if you have MyChart) OR A paper copy in the mail If you have any lab test that is abnormal or we need to change your treatment, we will call you to review the results.  Testing/Procedures: NONE ordered at this time of appointment   Follow-Up: At [facility], you and your health needs are our priority.  As part of our continuing mission to provide you with exceptional heart care, we have created designated Provider Care Teams.  These Care Teams include your primary Cardiologist (physician) and Advanced Practice Providers (APPs -  Physician Assistants and Nurse Practitioners) who all work together to provide you with the care you need, when you need it.  Your next appointment:   6 month(s)  The format for your next appointment:   In Person  Provider:   Peter [place], MD     Other Instructions  Important Information About Sugar

## 2022-08-06 NOTE — Progress Notes (Signed)
Cardiology Office Note:    Date:  08/08/2022   ID:  Anita Ferguson, Anita Ferguson 29-Apr-1976, MRN [id number]  PCP:  Soundra Pilon, FNP   [facility] Providers Cardiologist:  Peter Swaziland, MD     Referring MD: Soundra Pilon, FNP   Chief Complaint  Patient presents with   Follow-up    Seen for Dr. Swaziland    History of Present Illness:    Anita Ferguson is a 46 y.o. adult with a hx of SVT.  Patient was initially seen in the ED on 04/09/2022 due to episode of SVT.  She was helping move furniture in her child's room when she had chest tightness, palpitations and sweating.  Heart rate was in the 220s, blood pressure 99/74.  She was instructed to do Valsalva maneuver by EMS and was given 60 mg of adenosine which converted her to normal sinus rhythm.  Troponin borderline elevated at 34.  Electrolyte, TSH and CBC were normal.  She creatinine mildly elevated.  She was given hydration and sent home.  Patient was last seen by Dr. Swaziland in August 2023 at which time she was doing well without any recurrence of prolonged arrhythmia, although she did mention occasional palpitation.  She was given metoprolol tartrate 50 mg to use as needed.  She was also instructed to avoid stimulants.  If SVT occurs more frequently, may consider EP referral for ablation.  Subsequent echo gram obtained on 05/16/2022 showed EF 60 to 65%, no regional wall motion abnormality, mild MR.  Patient presents today for follow-up.  She was recently treated for URI, heart rate was in the 120s when she was sick.  She does not believe she has any prolonged episode of SVT.  She did use half tablet of metoprolol as needed on those days when she has to use the albuterol, however has since stopped metoprolol and albuterol as her symptom has improved.  Overall, she is doing well from the cardiac perspective and can follow-up in 6 months.  Past Medical History:  Diagnosis Date   Arrhythmia    Chronic kidney disease    Depression     SVT (supraventricular tachycardia)     Past Surgical History:  Procedure Laterality Date   CESAREAN SECTION     DILATION AND CURETTAGE OF UTERUS  2013   ESOPHAGOGASTRODUODENOSCOPY     WISDOM TOOTH EXTRACTION  1998    Current Medications: Current Meds  Medication Sig   albuterol (VENTOLIN HFA) 108 (90 Base) MCG/ACT inhaler Inhale 2 puffs into the lungs every 4 (four) hours as needed.   amoxicillin-clavulanate (AUGMENTIN) 875-125 MG tablet Take 1 tablet 2 times daily for 10 days.   Ascorbic Acid (VITAMIN C) 1000 MG tablet Take 1,000 mg by mouth daily.   buPROPion (WELLBUTRIN XL) 300 MG 24 hr tablet Take 300 mg by mouth daily.   Cholecalciferol (VITAMIN D) 50 MCG (2000 UT) CAPS Take 3 capsules by mouth daily.   [employer]S CRANBERRY PO Take 1 capsule by mouth daily.   fexofenadine (ALLEGRA ALLERGY) 180 MG tablet Take 1 tablet (180 mg total) by mouth at bedtime.   guaiFENesin (MUCINEX) 600 MG 12 hr tablet    levonorgestrel (MIRENA) 20 MCG/DAY IUD 1 each by Intrauterine route once.   metoprolol tartrate (LOPRESSOR) 50 MG tablet Take 1 tablet (50 mg total) by mouth daily as needed.   Omega-3 Fatty Acids (FISH OIL) 1200 MG CAPS Take 1 capsule by mouth daily.   Probiotic Product (PROBIOTIC PO)  Take 1 capsule by mouth daily.     Allergies:   Other, Codeine, Doxycycline, Morphine and related, and Trazodone   Social History   Socioeconomic History   Marital status: Married    Spouse name: Not on file   Number of children: 1   Years of education: Not on file   Highest education level: Not on file  Occupational History   Not on file  Tobacco Use   Smoking status: Never   Smokeless tobacco: Never  Vaping Use   Vaping Use: Never used  Substance and Sexual Activity   Alcohol use: Not Currently   Drug use: Never   Sexual activity: Not on file  Other Topics Concern   Not on file  Social History Narrative   Photographer   Social Determinants of Health   Financial Resource Strain: Not on  file  Food Insecurity: Not on file  Transportation Needs: Not on file  Physical Activity: Not on file  Stress: Not on file  Social Connections: Not on file     Family History: The patient's family history includes Brain cancer in her father; Cancer in her father; Hypertension in her father.  ROS:   Please see the history of present illness.     All other systems reviewed and are negative.  EKGs/Labs/Other Studies Reviewed:    The following studies were reviewed today:  Echo 05/16/2022 1. Left ventricular ejection fraction, by estimation, is 60 to 65%. Left  ventricular ejection fraction by 3D volume is 61 %. The left ventricle has  normal function. The left ventricle has no regional wall motion  abnormalities. Left ventricular diastolic   parameters were normal. The average left ventricular global longitudinal  strain is -22.3 %. The global longitudinal strain is normal.   2. Right ventricular systolic function is normal. The right ventricular  size is normal.   3. The mitral valve is normal in structure. Mild mitral valve  regurgitation. No evidence of mitral stenosis. 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   4. The aortic valve is normal in structure. Aortic valve regurgitation is  not visualized. No aortic stenosis is present.   5. The inferior vena cava is normal in size with greater than 50%  respiratory variability, suggesting right atrial pressure of 3 mmHg.    EKG:  EKG is not ordered today.    Recent Labs: 04/09/2022: BUN 13; Creatinine, Ser 1.17; Hemoglobin 13.9; Magnesium 1.9; Platelets 177; Potassium 4.1; Sodium 137; TSH 1.823  Recent Lipid Panel No results found for: "CHOL", "TRIG", "HDL", "CHOLHDL", "VLDL", "LDLCALC", "LDLDIRECT"   Risk Assessment/Calculations:           Physical Exam:    VS:  BP 104/70   Pulse 94   Ht 5\' 8"  (1.727 m)   Wt 194 lb 3.2 oz (88.1 kg)   SpO2 96%   BMI 29.53 kg/m        Wt  Readings from Last 3 Encounters:  08/06/22 194 lb 3.2 oz (88.1 kg)  05/01/22 196 lb 9.6 oz (89.2 kg)  04/09/22 195 lb (88.5 kg)     GEN:  Well nourished, well developed in no acute distress HEENT: Normal NECK: No JVD; No carotid bruits LYMPHATICS: No lymphadenopathy CARDIAC: RRR, no murmurs, rubs, gallops RESPIRATORY:  Clear to auscultation without rales, wheezing or rhonchi  ABDOMEN: Soft, non-tender, non-distended MUSCULOSKELETAL:  No edema; No deformity  SKIN: Warm and dry NEUROLOGIC:  Alert and oriented x 3  PSYCHIATRIC:  Normal affect   ASSESSMENT:    No diagnosis found. PLAN:    In order of problems listed above:  SVT: No prolonged episode of SVT recently.  She did have a URI and required albuterol treatment, she took half a tablet of metoprolol as needed on the days when she takes the albuterol to prevent her heart from going to SVT.  She has since stopped both albuterol and metoprolol.  Will continue metoprolol as needed.           Medication Adjustments/Labs and Tests Ordered: Current medicines are reviewed at length with the patient today.  Concerns regarding medicines are outlined above.  No orders of the defined types were placed in this encounter.  No orders of the defined types were placed in this encounter.   Patient Instructions  Medication Instructions:  Your physician recommends that you continue on your current medications as directed. Please refer to the Current Medication list given to you today.  *If you need a refill on your cardiac medications before your next appointment, please call your pharmacy*  Lab Work: NONE ordered at this time of appointment   If you have labs (blood work) drawn today and your tests are completely normal, you will receive your results only by: MyChart Message (if you have MyChart) OR A paper copy in the mail If you have any lab test that is abnormal or we need to change your treatment, we will call you to review the  results.  Testing/Procedures: NONE ordered at this time of appointment   Follow-Up: At [facility], you and your health needs are our priority.  As part of our continuing mission to provide you with exceptional heart care, we have created designated Provider Care Teams.  These Care Teams include your primary Cardiologist (physician) and Advanced Practice Providers (APPs -  Physician Assistants and Nurse Practitioners) who all work together to provide you with the care you need, when you need it.  Your next appointment:   6 month(s)  The format for your next appointment:   In Person  Provider:   Peter [place], MD     Other Instructions  Important Information About Sugar         Signed, Azalee Course, [place]  08/08/2022 3:11 PM    [facility]

## 2022-08-08 ENCOUNTER — Encounter: Payer: Self-pay | Admitting: Physician Assistant

## 2022-09-29 ENCOUNTER — Other Ambulatory Visit: Payer: Self-pay | Admitting: Obstetrics and Gynecology

## 2022-09-29 DIAGNOSIS — Z1231 Encounter for screening mammogram for malignant neoplasm of breast: Secondary | ICD-10-CM

## 2022-11-19 ENCOUNTER — Ambulatory Visit
Admission: RE | Admit: 2022-11-19 | Discharge: 2022-11-19 | Disposition: A | Discharge status: Home / Self Care | Payer: Managed Care, Other (non HMO) | Source: Ambulatory Visit | Attending: Obstetrics and Gynecology | Admitting: Obstetrics and Gynecology

## 2022-11-19 DIAGNOSIS — Z1231 Encounter for screening mammogram for malignant neoplasm of breast: Secondary | ICD-10-CM

## 2022-12-31 DIAGNOSIS — N183 Chronic kidney disease, stage 3 unspecified: Secondary | ICD-10-CM | POA: Insufficient documentation

## 2023-01-29 ENCOUNTER — Encounter: Payer: Self-pay | Admitting: Gastroenterology

## 2023-01-29 ENCOUNTER — Ambulatory Visit (AMBULATORY_SURGERY_CENTER): Payer: Managed Care, Other (non HMO)

## 2023-01-29 VITALS — Ht 68.0 in | Wt 200.0 lb

## 2023-01-29 DIAGNOSIS — Z1211 Encounter for screening for malignant neoplasm of colon: Secondary | ICD-10-CM

## 2023-01-29 MED ORDER — NA SULFATE-K SULFATE-MG SULF 17.5-3.13-1.6 GM/177ML PO SOLN: 1.0000 | Freq: Once | ORAL | 0 refills | Status: AC

## 2023-01-29 NOTE — Progress Notes (Signed)
No egg or soy allergy known to patient  No issues known to pt with past sedation with any surgeries or procedures Patient denies ever being told they had issues or difficulty with intubation  No FH of Malignant Hyperthermia Pt is not on diet pills Pt is not on  home 02  Pt is not on blood thinners  Pt denies issues with constipation  No A fib or A flutter Have any cardiac testing pending--yes June 3rd. Patient will call back if there are further testing or new medications. Pt instructed to use Singlecare.com or GoodRx for a price reduction on prep  Patient's chart reviewed by Cathlyn Parsons CNRA prior to previsit and patient appropriate for the [facility].  Previsit completed and red dot placed by patient's name on their procedure day (on provider's schedule).   Can ambulate without assistance .

## 2023-01-30 NOTE — Progress Notes (Unsigned)
Cardiology Office Note:    Date:  02/02/2023   ID:  Anita, Ferguson [birth year], MRN [id number]  PCP:  Anita Hale, MD   [facility] Providers Cardiologist:  Anita Hellberg [place], MD     Referring MD: Anita Pilon, FNP   No chief complaint on file.   History of Present Illness:    Anita Ferguson is a 47 y.o. adult seen at the request of Dr Anita Ferguson For evaluation of SVT. She was seen in the ED on 04/09/22 after episode of SVT. Patient states she was helping move furniture in her child's room when she began having chest tightness, palpitations, and sweating.  States she called EMS and was told she had a heart rate in the 220s and blood pressure 99/74.  EKG was reported to be in SVT.  Patient was instructed to do a Valsalva maneuver by EMS and given adenosine 6 mg which converted her to normal sinus rhythm. In the ED Ecg was normal. Labs were remarkable for troponin 34. Mild renal insufficiency. Electrolytes, TSH and CBC normal. She was given some hydration and DC home.   Since this episode she has no recurrence. States in the past she did have some palpitations but nothing sustained. She did cut out caffeine but was only drinking one cup a day. She is anxious about what happened and has been afraid to push her HR up.  Past Medical History:  Diagnosis Date   Arrhythmia    Chronic kidney disease    Depression    Hyperlipidemia    SVT (supraventricular tachycardia)     Past Surgical History:  Procedure Laterality Date   BREAST CYST ASPIRATION Right 2020   CESAREAN SECTION     DILATION AND CURETTAGE OF UTERUS  2013   ESOPHAGOGASTRODUODENOSCOPY     WISDOM TOOTH EXTRACTION  1998    Current Medications: Current Meds  Medication Sig   buPROPion (WELLBUTRIN XL) 300 MG 24 hr tablet Take 300 mg by mouth daily.   Cholecalciferol (VITAMIN D) 50 MCG (2000 UT) CAPS Take 3 capsules by mouth daily.   [employer] CRANBERRY PO Take 1 capsule by mouth daily.   fexofenadine  (ALLEGRA ALLERGY) 180 MG tablet Take 1 tablet (180 mg total) by mouth at bedtime.   levonorgestrel (MIRENA) 20 MCG/DAY IUD 1 each by Intrauterine route once.   Probiotic Product (PROBIOTIC PO) Take 1 capsule by mouth daily.     Allergies:   Other, Tree extract, Codeine, Morphine and codeine, Trazodone, and Doxycycline   Social History   Socioeconomic History   Marital status: Married    Spouse name: Not on file   Number of children: 1   Years of education: Not on file   Highest education level: Not on file  Occupational History   Not on file  Tobacco Use   Smoking status: Never   Smokeless tobacco: Never  Vaping Use   Vaping Use: Never used  Substance and Sexual Activity   Alcohol use: Not Currently   Drug use: Never   Sexual activity: Not on file  Other Topics Concern   Not on file  Social History Narrative   Photographer   Social Determinants of Health   Financial Resource Strain: Not on file  Food Insecurity: Not on file  Transportation Needs: Not on file  Physical Activity: Not on file  Stress: Not on file  Social Connections: Not on file     Family History: The patient's family history includes Brain cancer  in her father; Cancer in her father; Colon polyps in her father; Hypertension in her father; Stomach cancer in her paternal aunt. There is no history of Colon cancer, Esophageal cancer, or Rectal cancer.  ROS:   Please see the history of present illness.     All other systems reviewed and are negative.  EKGs/Labs/Other Studies Reviewed:    The following studies were reviewed today: I personally reviewed EMS Ecg on 04/09/22 which showed SVT rate 204. Then converted to NSR with normal Ecg.   Echo 05/16/22: IMPRESSIONS     1. Left ventricular ejection fraction, by estimation, is 60 to 65%. Left  ventricular ejection fraction by 3D volume is 61 %. The left ventricle has  normal function. The left ventricle has no regional wall motion  abnormalities. Left  ventricular diastolic   parameters were normal. The average left ventricular global longitudinal  strain is -22.3 %. The global longitudinal strain is normal.   2. Right ventricular systolic function is normal. The right ventricular  size is normal.   3. The mitral valve is normal in structure. Mild mitral valve  regurgitation. No evidence of mitral stenosis. 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   4. The aortic valve is normal in structure. Aortic valve regurgitation is  not visualized. No aortic stenosis is present.   5. The inferior vena cava is normal in size with greater than 50%  respiratory variability, suggesting right atrial pressure of 3 mmHg.    EKG:  EKG is not ordered today.     Recent Labs: 04/09/2022: BUN 13; Creatinine, Ser 1.17; Hemoglobin 13.9; Magnesium 1.9; Platelets 177; Potassium 4.1; Sodium 137; TSH 1.823  Recent Lipid Panel No results found for: "CHOL", "TRIG", "HDL", "CHOLHDL", "VLDL", "LDLCALC", "LDLDIRECT"  Dated 02/28/22: a1C 4.8%. cholesterol 193, triglycerides 236, HDL 47, LDL 105.  Dated 06/20/22: CMET normal.    Risk Assessment/Calculations:                Physical Exam:    VS:  BP 114/78 (BP Location: Right Arm, Patient Position: Sitting)   Pulse 98   Ht 5\' 8"  (1.727 m)   Wt 194 lb 12.8 oz (88.4 kg)   SpO2 98%   BMI 29.62 kg/m     Wt Readings from Last 3 Encounters:  02/02/23 194 lb 12.8 oz (88.4 kg)  01/29/23 200 lb (90.7 kg)  08/06/22 194 lb 3.2 oz (88.1 kg)     GEN:  Well nourished, well developed in no acute distress HEENT: Normal NECK: No JVD; No carotid bruits LYMPHATICS: No lymphadenopathy CARDIAC: RRR, no murmurs, rubs, gallops RESPIRATORY:  Clear to auscultation without rales, wheezing or rhonchi  ABDOMEN: Soft, non-tender, non-distended MUSCULOSKELETAL:  No edema; No deformity  SKIN: Warm and dry NEUROLOGIC:  Alert and oriented x 3 PSYCHIATRIC:  Normal affect   ASSESSMENT:     1. SVT (supraventricular tachycardia)     PLAN:    In order of problems listed above:  SVT with sudden onset and termination with IV adenosine c/w reentry tachycardia. Discussed mechanism of arrhythmia. Echo normal.  Given Rx for metoprolol tartrate 50 mg to use PRN. Will avoid stimulants. Instructed in Valsalva maneuver. If SVT recurs  would consider ablation      Follow up in one year      Medication Adjustments/Labs and Tests Ordered: Current medicines are reviewed at length with the patient today.  Concerns regarding medicines are outlined above.  No orders  of the defined types were placed in this encounter.  No orders of the defined types were placed in this encounter.   There are no Patient Instructions on file for this visit.   Signed, Cosmo Tetreault [place], MD  02/02/2023 9:22 AM    [facility]

## 2023-02-02 ENCOUNTER — Ambulatory Visit: Payer: Managed Care, Other (non HMO) | Attending: Cardiology | Admitting: Cardiology

## 2023-02-02 ENCOUNTER — Encounter: Payer: Self-pay | Admitting: Cardiology

## 2023-02-02 VITALS — BP 114/78 | HR 98 | Ht 68.0 in | Wt 194.8 lb

## 2023-02-02 DIAGNOSIS — I471 Supraventricular tachycardia, unspecified: Secondary | ICD-10-CM

## 2023-02-02 NOTE — Patient Instructions (Signed)
Medication Instructions:  Continue same medications *If you need a refill on your cardiac medications before your next appointment, please call your pharmacy*   Lab Work: None  ordered  Testing/Procedures: None ordered   Follow-Up: At [facility], you and your health needs are our priority.  As part of our continuing mission to provide you with exceptional heart care, we have created designated Provider Care Teams.  These Care Teams include your primary Cardiologist (physician) and Advanced Practice Providers (APPs -  Physician Assistants and Nurse Practitioners) who all work together to provide you with the care you need, when you need it.  We recommend signing up for the patient portal called "MyChart".  Sign up information is provided on this After Visit Summary.  MyChart is used to connect with patients for Virtual Visits (Telemedicine).  Patients are able to view lab/test results, encounter notes, upcoming appointments, etc.  Non-urgent messages can be sent to your provider as well.   To learn more about what you can do with MyChart, go to [email or web address].    Your next appointment:  1 year    Call in March to schedule June appointment     Provider:  Dr.Jordan   

## 2023-02-03 ENCOUNTER — Other Ambulatory Visit: Payer: Self-pay

## 2023-02-03 ENCOUNTER — Telehealth: Payer: Self-pay | Admitting: Gastroenterology

## 2023-02-03 DIAGNOSIS — Z1211 Encounter for screening for malignant neoplasm of colon: Secondary | ICD-10-CM

## 2023-02-03 NOTE — Telephone Encounter (Signed)
Inbound call from patient wanting to speak with a nurse. She stated she recently have medications change and want to go over before procedure on 6/13.Please advise.

## 2023-02-03 NOTE — Telephone Encounter (Signed)
Call returned. All questions answered.  

## 2023-02-12 ENCOUNTER — Ambulatory Visit: Payer: Managed Care, Other (non HMO) | Admitting: Gastroenterology

## 2023-02-12 ENCOUNTER — Encounter: Payer: Self-pay | Admitting: Gastroenterology

## 2023-02-12 VITALS — BP 118/80 | HR 70 | Temp 98.7°F | Resp 11 | Ht 68.0 in | Wt 200.0 lb

## 2023-02-12 DIAGNOSIS — D123 Benign neoplasm of transverse colon: Secondary | ICD-10-CM

## 2023-02-12 DIAGNOSIS — Z1211 Encounter for screening for malignant neoplasm of colon: Secondary | ICD-10-CM

## 2023-02-12 MED ORDER — SODIUM CHLORIDE 0.9 % IV SOLN: 500.0000 mL | Freq: Once | INTRAVENOUS | Status: DC

## 2023-02-12 NOTE — Progress Notes (Signed)
[facility] History and Physical   Primary Care Physician:  Shon Hale, MD   Reason for Procedure:   Colon cancer screening  Plan:    Screening colonoscopy     HPI: Anita Ferguson is a 47 y.o. adult undergoing initial average risk screening colonoscopy.  She has no family history of colon cancer and no chronic GI symptoms.    Past Medical History:  Diagnosis Date   Arrhythmia    Chronic kidney disease    Depression    Hyperlipidemia    SVT (supraventricular tachycardia)     Past Surgical History:  Procedure Laterality Date   BREAST CYST ASPIRATION Right 2020   CESAREAN SECTION     DILATION AND CURETTAGE OF UTERUS  2013   ESOPHAGOGASTRODUODENOSCOPY     WISDOM TOOTH EXTRACTION  1998    Prior to Admission medications   Medication Sig Start Date End Date Taking? Authorizing Provider  buPROPion (WELLBUTRIN XL) 300 MG 24 hr tablet Take 300 mg by mouth daily.   Yes [provider]  Cholecalciferol (VITAMIN D) 50 MCG (2000 UT) CAPS Take 3 capsules by mouth daily.   Yes [provider]  [employer] CRANBERRY PO Take 1 capsule by mouth daily.   Yes [provider]  fexofenadine (ALLEGRA ALLERGY) 180 MG tablet Take 1 tablet (180 mg total) by mouth at bedtime. 05/01/22  Yes [place], Peter M, MD  levonorgestrel (MIRENA) 20 MCG/DAY IUD 1 each by Intrauterine route once.   Yes [provider]  Probiotic Product (PROBIOTIC PO) Take 1 capsule by mouth daily.   Yes [provider]  metoprolol tartrate (LOPRESSOR) 50 MG tablet Take 1 tablet (50 mg total) by mouth daily as needed. 05/01/22 08/06/22  [place], Peter M, MD  triamcinolone cream (KENALOG) 0.1 % Apply 1 Application topically 2 (two) times daily as needed. Patient not taking: Reported on 02/12/2023 01/31/23   [provider]    Current Outpatient Medications  Medication Sig Dispense Refill   buPROPion (WELLBUTRIN XL) 300 MG 24 hr tablet Take 300 mg by mouth  daily.     Cholecalciferol (VITAMIN D) 50 MCG (2000 UT) CAPS Take 3 capsules by mouth daily.     [employer]S CRANBERRY PO Take 1 capsule by mouth daily.     fexofenadine (ALLEGRA ALLERGY) 180 MG tablet Take 1 tablet (180 mg total) by mouth at bedtime.     levonorgestrel (MIRENA) 20 MCG/DAY IUD 1 each by Intrauterine route once.     Probiotic Product (PROBIOTIC PO) Take 1 capsule by mouth daily.     metoprolol tartrate (LOPRESSOR) 50 MG tablet Take 1 tablet (50 mg total) by mouth daily as needed. 30 tablet 3   triamcinolone cream (KENALOG) 0.1 % Apply 1 Application topically 2 (two) times daily as needed. (Patient not taking: Reported on 02/12/2023)     Current Facility-Administered Medications  Medication Dose Route Frequency Provider Last Rate Last Admin   0.9 %  sodium chloride infusion  500 mL Intravenous Once Jenel Lucks, MD        Allergies as of 02/12/2023 - Review Complete 02/12/2023  Allergen Reaction Noted   Other Swelling 10/21/2018   Tree extract Swelling 10/21/2018   Codeine Nausea And Vomiting and Other (See Comments) 04/09/2022   Morphine and codeine Nausea And Vomiting 05/04/2019   Trazodone  03/25/2022   Doxycycline Rash and Other (See Comments) 03/25/2022    Family History  Problem Relation Age of Onset   Colon polyps Father  Cancer Father    Hypertension Father    Brain cancer Father    Stomach cancer Paternal Aunt    Colon cancer Neg Hx    Esophageal cancer Neg Hx    Rectal cancer Neg Hx     Social History   Socioeconomic History   Marital status: Married    Spouse name: Not on file   Number of children: 1   Years of education: Not on file   Highest education level: Not on file  Occupational History   Not on file  Tobacco Use   Smoking status: Never   Smokeless tobacco: Never  Vaping Use   Vaping Use: Never used  Substance and Sexual Activity   Alcohol use: Not Currently   Drug use: Never   Sexual activity: Not on file  Other Topics Concern    Not on file  Social History Narrative   Photographer   Social Determinants of Health   Financial Resource Strain: Not on file  Food Insecurity: Not on file  Transportation Needs: Not on file  Physical Activity: Not on file  Stress: Not on file  Social Connections: Not on file  Intimate Partner Violence: Not on file    Review of Systems:  All other review of systems negative except as mentioned in the HPI.  Physical Exam: Vital signs BP 128/77   Pulse 77   Temp 98.7 F (37.1 C)   Ht 5\' 8"  (1.727 m)   Wt 200 lb (90.7 kg)   SpO2 99%   BMI 30.41 kg/m   General:   Alert,  Well-developed, well-nourished, pleasant and cooperative in NAD Airway:  Mallampati 2 Lungs:  Clear throughout to auscultation.   Heart:  Regular rate and rhythm; no murmurs, clicks, rubs,  or gallops. Abdomen:  Soft, nontender and nondistended. Normal bowel sounds.   Neuro/Psych:  Normal mood and affect. A and O x 3   Jasn Xia E. Tomasa Rand, MD [facility]

## 2023-02-12 NOTE — Progress Notes (Signed)
Vss nad trans to pacu 

## 2023-02-12 NOTE — Progress Notes (Signed)
Pt's states no medical or surgical changes since previsit or office visit. 

## 2023-02-12 NOTE — Patient Instructions (Addendum)
Handout on polyps given to patient. Await pathology results. Resume previous diet and continue present medications.  Repeat colonoscopy for surveillance will be determined based off of pathology results.   YOU HAD AN ENDOSCOPIC PROCEDURE TODAY AT THE [facility]:   Refer to the procedure report that was given to you for any specific questions about what was found during the examination.  If the procedure report does not answer your questions, please call your gastroenterologist to clarify.  If you requested that your care partner not be given the details of your procedure findings, then the procedure report has been included in a sealed envelope for you to review at your convenience later.  YOU SHOULD EXPECT: Some feelings of bloating in the abdomen. Passage of more gas than usual.  Walking can help get rid of the air that was put into your GI tract during the procedure and reduce the bloating. If you had a lower endoscopy (such as a colonoscopy or flexible sigmoidoscopy) you may notice spotting of blood in your stool or on the toilet paper. If you underwent a bowel prep for your procedure, you may not have a normal bowel movement for a few days.  Please Note:  You might notice some irritation and congestion in your nose or some drainage.  This is from the oxygen used during your procedure.  There is no need for concern and it should clear up in a day or so.  SYMPTOMS TO REPORT IMMEDIATELY:  Following lower endoscopy (colonoscopy or flexible sigmoidoscopy):  Excessive amounts of blood in the stool  Significant tenderness or worsening of abdominal pains  Swelling of the abdomen that is new, acute  Fever of 100F or higher  For urgent or emergent issues, a gastroenterologist can be reached at any hour by calling [phone]. Do not use MyChart messaging for urgent concerns.    DIET:  We do recommend a small meal at first, but then you may proceed to your regular diet.  Drink  plenty of fluids but you should avoid alcoholic beverages for 24 hours.  ACTIVITY:  You should plan to take it easy for the rest of today and you should NOT DRIVE or use heavy machinery until tomorrow (because of the sedation medicines used during the test).    FOLLOW UP: Our staff will call the number listed on your records the next business day following your procedure.  We will call around 7:15- 8:00 am to check on you and address any questions or concerns that you may have regarding the information given to you following your procedure. If we do not reach you, we will leave a message.     If any biopsies were taken you will be contacted by phone or by letter within the next 1-3 weeks.  Please call us at [phone] if you have not heard about the biopsies in 3 weeks.    SIGNATURES/CONFIDENTIALITY: You and/or your care partner have signed paperwork which will be entered into your electronic medical record.  These signatures attest to the fact that that the information above on your After Visit Summary has been reviewed and is understood.  Full responsibility of the confidentiality of this discharge information lies with you and/or your care-partner. 

## 2023-02-12 NOTE — Op Note (Signed)
[facility] Patient Name: Anita Ferguson Procedure Date: 02/12/2023 10:26 AM MRN: [id number] Endoscopist: Lorin Picket E. Tomasa Rand , MD, [id number] Age: 47 Referring MD:  Date of Birth: 31-Jul-1976 Gender: Female Account #: [id number] Procedure:                Colonoscopy Indications:              Screening for colorectal malignant neoplasm, This                            is the patient's first colonoscopy Medicines:                Monitored Anesthesia Care Procedure:                Pre-Anesthesia Assessment:                           - Prior to the procedure, a History and Physical                            was performed, and patient medications and                            allergies were reviewed. The patient's tolerance of                            previous anesthesia was also reviewed. The risks                            and benefits of the procedure and the sedation                            options and risks were discussed with the patient.                            All questions were answered, and informed consent                            was obtained. Prior Anticoagulants: The patient has                            taken no anticoagulant or antiplatelet agents. ASA                            Grade Assessment: II - A patient with mild systemic                            disease. After reviewing the risks and benefits,                            the patient was deemed in satisfactory condition to                            undergo the procedure.  After obtaining informed consent, the colonoscope                            was passed under direct vision. Throughout the                            procedure, the patient's blood pressure, pulse, and                            oxygen saturations were monitored continuously. The                            Olympus CF-HQ190L [id number]) Colonoscope was                            introduced through the  anus and advanced to the the                            terminal ileum, with identification of the                            appendiceal orifice and IC valve. The colonoscopy                            was performed without difficulty. The patient                            tolerated the procedure well. The quality of the                            bowel preparation was excellent. The terminal                            ileum, ileocecal valve, appendiceal orifice, and                            rectum were photographed. The bowel preparation                            used was SUPREP via split dose instruction. Scope In: 10:31:14 AM Scope Out: 10:52:54 AM Scope Withdrawal Time: 0 hours 15 minutes 29 seconds  Total Procedure Duration: 0 hours 21 minutes 40 seconds  Findings:                 The perianal and digital rectal examinations were                            normal. Pertinent negatives include normal                            sphincter tone and no palpable rectal lesions.                           A 3 mm polyp was found in the hepatic flexure. The  polyp was sessile. The polyp was removed with a                            cold snare. Resection and retrieval were complete.                            Estimated blood loss was minimal.                           Two sessile polyps were found in the transverse                            colon. The polyps were 3 to 4 mm in size. These                            polyps were removed with a cold snare. Resection                            and retrieval were complete. Estimated blood loss                            was minimal.                           The exam was otherwise normal throughout the                            examined colon.                           The terminal ileum appeared normal.                           The retroflexed view of the distal rectum and anal                            verge was  normal and showed no anal or rectal                            abnormalities. Complications:            No immediate complications. Estimated Blood Loss:     Estimated blood loss was minimal. Impression:               - One 3 mm polyp at the hepatic flexure, removed                            with a cold snare. Resected and retrieved.                           - Two 3 to 4 mm polyps in the transverse colon,                            removed with a cold snare. Resected and retrieved.                           -  The examined portion of the ileum was normal.                           - The distal rectum and anal verge are normal on                            retroflexion view. Recommendation:           - Patient has a contact number available for                            emergencies. The signs and symptoms of potential                            delayed complications were discussed with the                            patient. Return to normal activities tomorrow.                            Written discharge instructions were provided to the                            patient.                           - Resume previous diet.                           - Continue present medications.                           - Await pathology results.                           - Repeat colonoscopy (date not yet determined) for                            surveillance based on pathology results. Chantella Creech E. Tomasa Rand, MD 02/12/2023 10:58:17 AM This report has been signed electronically.

## 2023-02-13 ENCOUNTER — Telehealth: Payer: Self-pay | Admitting: *Deleted

## 2023-02-13 NOTE — Telephone Encounter (Signed)
  Follow up Call-    Row Labels 02/12/2023   10:08 AM  Call back number   Section Header. No data exists in this row.   Post procedure Call Back phone  #   [phone]  Permission to leave phone message   Yes     Patient questions:  Do you have a fever, pain , or abdominal swelling? No. Pain Score  0 *  Have you tolerated food without any problems? Yes.    Have you been able to return to your normal activities? Yes.    Do you have any questions about your discharge instructions: Diet   No. Medications  No. Follow up visit  No.  Do you have questions or concerns about your Care? No.  Actions: * If pain score is 4 or above: No action needed, pain <4.

## 2023-02-18 NOTE — Progress Notes (Signed)
Anita Ferguson,   The three polyps that I removed during your recent procedure were completely benign but were proven to be "pre-cancerous" polyps that MAY have grown into cancers if they had not been removed.  Studies shows that at least 20% of women over age 47 and 30% of men over age 32 have pre-cancerous polyps.  Based on current nationally recognized surveillance guidelines, I recommend that you have a repeat colonoscopy in 5 years.   If you develop any new rectal bleeding, abdominal pain or significant bowel habit changes, please contact me before then.

## 2023-10-02 ENCOUNTER — Other Ambulatory Visit: Payer: Self-pay | Admitting: Nephrology

## 2023-10-02 DIAGNOSIS — R7989 Other specified abnormal findings of blood chemistry: Secondary | ICD-10-CM

## 2023-10-05 ENCOUNTER — Ambulatory Visit
Admission: RE | Admit: 2023-10-05 | Discharge: 2023-10-05 | Discharge status: Home / Self Care | Payer: Managed Care, Other (non HMO) | Source: Ambulatory Visit | Attending: Nephrology | Admitting: Nephrology

## 2023-10-05 DIAGNOSIS — R7989 Other specified abnormal findings of blood chemistry: Secondary | ICD-10-CM

## 2024-01-28 ENCOUNTER — Ambulatory Visit: Admitting: Physician Assistant

## 2024-02-01 ENCOUNTER — Ambulatory Visit: Attending: Physician Assistant | Admitting: Physician Assistant

## 2024-02-01 ENCOUNTER — Encounter: Payer: Self-pay | Admitting: Physician Assistant

## 2024-02-01 VITALS — BP 112/66 | HR 80 | Ht 68.0 in | Wt 176.6 lb

## 2024-02-01 DIAGNOSIS — I471 Supraventricular tachycardia, unspecified: Secondary | ICD-10-CM | POA: Diagnosis not present

## 2024-02-01 DIAGNOSIS — N183 Chronic kidney disease, stage 3 unspecified: Secondary | ICD-10-CM

## 2024-02-01 MED ORDER — METOPROLOL TARTRATE 50 MG PO TABS: 50.0000 mg | ORAL_TABLET | Freq: Every day | ORAL | 3 refills | Status: AC | PRN

## 2024-02-01 NOTE — Progress Notes (Signed)
 Cardiology Office Note   Date:  02/01/2024  ID:  Anita, Ferguson 07-12-1976, MRN [id number] PCP: Ransom Byers, MD  [facility] Providers Cardiologist:  Peter Swaziland, MD     History of Present Illness Anita Ferguson is a 48 y.o. adult with a hx of SVT.  Patient was initially seen in the ED on 04/09/2022 due to episode of SVT.  She was helping move furniture in her child's room when she had chest tightness, palpitations and sweating.  Heart rate was in the 220s, blood pressure 99/74.  She was instructed to do Valsalva maneuver by EMS and was given 6 mg of adenosine which converted her to normal sinus rhythm.  Troponin borderline elevated at 34.  Electrolyte, TSH and CBC were normal.  She creatinine mildly elevated.  She was given hydration and sent home.  She has metoprolol  tartrate 50 mg to use as needed.  She was also instructed to avoid stimulants.  If SVT occurs more frequently, may consider EP referral for ablation.  Subsequent echo obtained on 05/16/2022 showed EF 60 to 65%, no regional wall motion abnormality, mild MR.    Patient presents today for follow-up.  She denies any recent chest pain or shortness of breath.  She has no lower extremity edema, orthopnea or PND.  Her lipid panel from last year in July 2024 showed a total cholesterol 205, HDL 52, LDL 123, triglyceride of 173.  Since then, she has managed to lose 30 pounds, she will be seeing her PCP in July who will do repeat annual blood work.  She has also seen nephrologist due to mild bump in the creatinine, this was attributed to the use of NSAIDs.  Most recent creatinine obtained in March 2025 was 0.89.  Overall, the patient has been doing well and has not needed to take the metoprolol  in the past year, she can follow-up with Dr. Swaziland in 1 year.  ROS:   She denies chest pain, palpitations, dyspnea, pnd, orthopnea, n, v, dizziness, syncope, edema, weight gain, or early satiety. All other systems reviewed and  are otherwise negative except as noted above.    Studies Reviewed      Cardiac Studies & Procedures   ______________________________________________________________________________________________     ECHOCARDIOGRAM  ECHOCARDIOGRAM COMPLETE 05/16/2022  Narrative ECHOCARDIOGRAM REPORT    Patient Name:   Anita Ferguson Date of Exam: 05/16/2022 Medical Rec #:  [id number]          Height:       68.0 in Accession #:    [id number]         Weight:       196.6 lb Date of Birth:  1976/04/12         BSA:          2.029 m Patient Age:    45 years           BP:           110/72 mmHg Patient Gender: F                  HR:           86 bpm. Exam Location:  [address]  Procedure: 2D Echo, Color Doppler, Cardiac Doppler, 3D Echo and Strain Analysis  Indications:    I47.1 SVT  History:        Patient has no prior history of Echocardiogram examinations.  Sonographer:    Joleen Navy RDCS Referring Phys: [phone] Hubert Madden  JORDAN  IMPRESSIONS   1. Left ventricular ejection fraction, by estimation, is 60 to 65%. Left ventricular ejection fraction by 3D volume is 61 %. The left ventricle has normal function. The left ventricle has no regional wall motion abnormalities. Left ventricular diastolic parameters were normal. The average left ventricular global longitudinal strain is -22.3 %. The global longitudinal strain is normal. 2. Right ventricular systolic function is normal. The right ventricular size is normal. 3. The mitral valve is normal in structure. Mild mitral valve regurgitation. No evidence of mitral stenosis. 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 4. The aortic valve is normal in structure. Aortic valve regurgitation is not visualized. No aortic stenosis is present. 5. The inferior vena cava is normal in size with greater than 50% respiratory variability, suggesting right atrial pressure of 3 mmHg.  FINDINGS Left Ventricle: Left  ventricular ejection fraction, by estimation, is 60 to 65%. Left ventricular ejection fraction by 3D volume is 61 %. The left ventricle has normal function. The left ventricle has no regional wall motion abnormalities. The average left ventricular global longitudinal strain is -22.3 %. The global longitudinal strain is normal. The left ventricular internal cavity size was normal in size. There is no left ventricular hypertrophy. Left ventricular diastolic parameters were normal. Normal left ventricular filling pressure.  Right Ventricle: The right ventricular size is normal. No increase in right ventricular wall thickness. Right ventricular systolic function is normal.  Left Atrium: Left atrial size was normal in size.  Right Atrium: Right atrial size was normal in size.  Pericardium: There is no evidence of pericardial effusion.  Mitral Valve: The mitral valve is normal in structure. 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 Mild mitral valve regurgitation, with centrally-directed jet. No evidence of mitral valve stenosis.  Tricuspid Valve: The tricuspid valve is normal in structure. Tricuspid valve regurgitation is not demonstrated. No evidence of tricuspid stenosis.  Aortic Valve: The aortic valve is normal in structure. Aortic valve regurgitation is not visualized. No aortic stenosis is present.  Pulmonic Valve: The pulmonic valve was normal in structure. Pulmonic valve regurgitation is not visualized. No evidence of pulmonic stenosis.  Aorta: The aortic root is normal in size and structure.  Venous: The inferior vena cava is normal in size with greater than 50% respiratory variability, suggesting right atrial pressure of 3 mmHg.  IAS/Shunts: No atrial level shunt detected by color flow Doppler.   LEFT VENTRICLE PLAX 2D LVIDd:         4.70 cm   Diastology LVIDs:         3.00 cm   LV e' medial:    8.27 cm/s LV PW:         1.00 cm   LV E/e' medial:   11.2 LV IVS:        0.90 cm   LV e' lateral:   9.03 cm/s LVOT diam:     2.20 cm   LV E/e' lateral: 10.3 LV SV:         71 LV SV Index:   35        2D Longitudinal Strain LVOT Area:     3.80 cm  2D Strain GLS Avg:     -22.3 %  3D Volume EF: 3D EF:        61 % LV EDV:       126 ml LV ESV:       49  ml LV SV:        78 ml  RIGHT VENTRICLE RV Basal diam:  3.40 cm RV Mid diam:    2.90 cm RV S prime:     16.20 cm/s TAPSE (M-mode): 2.2 cm  LEFT ATRIUM           Index        RIGHT ATRIUM           Index LA diam:      3.50 cm 1.73 cm/m   RA Area:     14.70 cm LA Vol (A2C): 54.1 ml 26.66 ml/m  RA Volume:   37.30 ml  18.38 ml/m AORTIC VALVE LVOT Vmax:   96.90 cm/s LVOT Vmean:  63.400 cm/s LVOT VTI:    0.187 m  AORTA Ao Root diam: 2.90 cm Ao Asc diam:  3.40 cm  MITRAL VALVE MV Area (PHT): 5.02 cm    SHUNTS MV Decel Time: 151 msec    Systemic VTI:  0.19 m MV E velocity: 92.80 cm/s  Systemic Diam: 2.20 cm MV A velocity: 65.50 cm/s MV E/A ratio:  1.42  Mihai Croitoru MD Electronically signed by Luana Rumple MD Signature Date/Time: 05/16/2022/4:38:29 PM    Final          ______________________________________________________________________________________________      Risk Assessment/Calculations           Physical Exam VS:  BP 112/66   Pulse 80   Ht 5\' 8"  (1.727 m)   Wt 176 lb 9.6 oz (80.1 kg)   SpO2 99%   BMI 26.85 kg/m    Wt Readings from Last 3 Encounters:  02/01/24 176 lb 9.6 oz (80.1 kg)  02/12/23 200 lb (90.7 kg)  02/02/23 194 lb 12.8 oz (88.4 kg)    GEN: Well nourished, well developed in no acute distress NECK: No JVD; No carotid bruits CARDIAC: RRR, no murmurs, rubs, gallops RESPIRATORY:  Clear to auscultation without rales, wheezing or rhonchi  ABDOMEN: Soft, non-tender, non-distended EXTREMITIES:  No edema; No deformity   ASSESSMENT AND PLAN  History of SVT: Solitary episode of SVT in August 2023, converted with Valsalva maneuver and  adenosine.  Only time she has taken the as needed dose of metoprolol  since then is when she was dealing with URI during the winter 2023/2024 and had to take a lot of breathing treatment at the time.  She has not required as needed dose of metoprolol  since nor has she had any recurrent palpitation.  Continue to follow-up on a yearly basis.        Dispo: Follow-up with Dr. Swaziland in 1 year  Signed, Azarya Oconnell, [place]

## 2024-02-01 NOTE — Patient Instructions (Signed)
 Medication Instructions:  NO CHANGES *If you need a refill on your cardiac medications before your next appointment, please call your pharmacy*  Lab Work: NO LABS If you have labs (blood work) drawn today and your tests are completely normal, you will receive your results only by: MyChart Message (if you have MyChart) OR A paper copy in the mail If you have any lab test that is abnormal or we need to change your treatment, we will call you to review the results.  Testing/Procedures: NO TESTING  Follow-Up: At [facility], you and your health needs are our priority.  As part of our continuing mission to provide you with exceptional heart care, our providers are all part of one team.  This team includes your primary Cardiologist (physician) and Advanced Practice Providers or APPs (Physician Assistants and Nurse Practitioners) who all work together to provide you with the care you need, when you need it.  Your next appointment:   1 year(s)  Provider:   Peter Swaziland, MD
